# Patient Record
Sex: Female | Born: 1970
Health system: Southern US, Community
[De-identification: ages and names within clinical notes are randomized; demographics above are authoritative.]

## PROBLEM LIST (undated history)

## (undated) DIAGNOSIS — E119 Type 2 diabetes mellitus without complications: Secondary | ICD-10-CM

## (undated) DIAGNOSIS — M1710 Unilateral primary osteoarthritis, unspecified knee: Secondary | ICD-10-CM

## (undated) DIAGNOSIS — E559 Vitamin D deficiency, unspecified: Secondary | ICD-10-CM

## (undated) DIAGNOSIS — G4730 Sleep apnea, unspecified: Secondary | ICD-10-CM

## (undated) DIAGNOSIS — D219 Benign neoplasm of connective and other soft tissue, unspecified: Secondary | ICD-10-CM

## (undated) DIAGNOSIS — W19XXXA Unspecified fall, initial encounter: Secondary | ICD-10-CM

## (undated) DIAGNOSIS — E7800 Pure hypercholesterolemia, unspecified: Secondary | ICD-10-CM

## (undated) DIAGNOSIS — R7303 Prediabetes: Secondary | ICD-10-CM

## (undated) HISTORY — DX: Unilateral primary osteoarthritis, unspecified knee: M17.10

## (undated) HISTORY — DX: Morbid (severe) obesity due to excess calories: E66.01

## (undated) HISTORY — DX: Type 2 diabetes mellitus without complications: E11.9

## (undated) HISTORY — DX: Vitamin D deficiency, unspecified: E55.9

## (undated) HISTORY — DX: Benign neoplasm of connective and other soft tissue, unspecified: D21.9

## (undated) HISTORY — DX: Unspecified fall, initial encounter: W19.XXXA

## (undated) HISTORY — DX: Sleep apnea, unspecified: G47.30

## (undated) HISTORY — DX: Pure hypercholesterolemia, unspecified: E78.00

## (undated) HISTORY — DX: Prediabetes: R73.03

---
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
## 1994-11-19 HISTORY — PX: UMBILICAL HERNIA REPAIR: SHX196

## 2007-09-10 ENCOUNTER — Encounter: Payer: Self-pay | Admitting: Obstetrics and Gynecology

## 2007-09-10 ENCOUNTER — Ambulatory Visit: Payer: Self-pay | Admitting: Obstetrics & Gynecology

## 2007-09-11 ENCOUNTER — Ambulatory Visit: Payer: Self-pay | Admitting: *Deleted

## 2007-09-11 ENCOUNTER — Inpatient Hospital Stay (HOSPITAL_COMMUNITY): Admission: AD | Admit: 2007-09-11 | Discharge: 2007-09-14 | Payer: Self-pay | Admitting: Obstetrics & Gynecology

## 2011-02-20 ENCOUNTER — Other Ambulatory Visit: Payer: Self-pay | Admitting: Infectious Diseases

## 2011-02-20 ENCOUNTER — Ambulatory Visit
Admission: RE | Admit: 2011-02-20 | Discharge: 2011-02-20 | Disposition: A | Discharge status: Home / Self Care | Payer: No Typology Code available for payment source | Source: Ambulatory Visit | Attending: Infectious Diseases | Admitting: Infectious Diseases

## 2011-02-20 DIAGNOSIS — R7611 Nonspecific reaction to tuberculin skin test without active tuberculosis: Secondary | ICD-10-CM

## 2011-08-29 LAB — POCT URINALYSIS DIP (DEVICE)
Ketones, ur: NEGATIVE
Nitrite: NEGATIVE
Protein, ur: 30 — AB
Urobilinogen, UA: 1
pH: 6.5

## 2011-08-29 LAB — CBC
Hemoglobin: 11.1 — ABNORMAL LOW
MCHC: 33.6
MCV: 80.5
Platelets: 188
RBC: 4.09
RDW: 15.8 — ABNORMAL HIGH

## 2011-08-29 LAB — URINE MICROSCOPIC-ADD ON

## 2011-08-29 LAB — URINALYSIS, ROUTINE W REFLEX MICROSCOPIC: Protein, ur: NEGATIVE

## 2012-01-18 HISTORY — PX: CATARACT EXTRACTION: SUR2

## 2012-08-15 ENCOUNTER — Other Ambulatory Visit: Payer: Self-pay | Admitting: Gynecology

## 2012-08-15 DIAGNOSIS — Z1231 Encounter for screening mammogram for malignant neoplasm of breast: Secondary | ICD-10-CM

## 2012-09-09 ENCOUNTER — Ambulatory Visit
Admission: RE | Admit: 2012-09-09 | Discharge: 2012-09-09 | Disposition: A | Discharge status: Home / Self Care | Payer: 59 | Source: Ambulatory Visit | Attending: Gynecology | Admitting: Gynecology

## 2012-09-09 DIAGNOSIS — Z1231 Encounter for screening mammogram for malignant neoplasm of breast: Secondary | ICD-10-CM

## 2012-09-11 ENCOUNTER — Other Ambulatory Visit: Payer: Self-pay | Admitting: Gynecology

## 2012-09-11 DIAGNOSIS — R928 Other abnormal and inconclusive findings on diagnostic imaging of breast: Secondary | ICD-10-CM

## 2012-09-17 ENCOUNTER — Ambulatory Visit
Admission: RE | Admit: 2012-09-17 | Discharge: 2012-09-17 | Disposition: A | Discharge status: Home / Self Care | Payer: 59 | Source: Ambulatory Visit | Attending: Gynecology | Admitting: Gynecology

## 2012-09-17 DIAGNOSIS — R928 Other abnormal and inconclusive findings on diagnostic imaging of breast: Secondary | ICD-10-CM

## 2013-02-20 ENCOUNTER — Ambulatory Visit: Payer: Self-pay

## 2013-02-20 ENCOUNTER — Other Ambulatory Visit: Payer: Self-pay | Admitting: Occupational Medicine

## 2013-02-20 DIAGNOSIS — R52 Pain, unspecified: Secondary | ICD-10-CM

## 2013-08-12 ENCOUNTER — Other Ambulatory Visit: Payer: Self-pay

## 2013-08-12 ENCOUNTER — Encounter: Payer: Self-pay | Admitting: Gynecology

## 2013-08-12 DIAGNOSIS — Z1231 Encounter for screening mammogram for malignant neoplasm of breast: Secondary | ICD-10-CM

## 2013-08-17 ENCOUNTER — Ambulatory Visit (INDEPENDENT_AMBULATORY_CARE_PROVIDER_SITE_OTHER): Payer: 59 | Admitting: Gynecology

## 2013-08-17 ENCOUNTER — Encounter: Payer: Self-pay | Admitting: Gynecology

## 2013-08-17 VITALS — BP 118/78 | HR 80 | Resp 18 | Ht 65.5 in | Wt 242.0 lb

## 2013-08-17 DIAGNOSIS — Z0000 Encounter for general adult medical examination without abnormal findings: Secondary | ICD-10-CM

## 2013-08-17 DIAGNOSIS — Z01419 Encounter for gynecological examination (general) (routine) without abnormal findings: Secondary | ICD-10-CM

## 2013-08-17 LAB — LIPID PANEL
Cholesterol: 197 mg/dL (ref 0–200)
LDL Cholesterol: 110 mg/dL — ABNORMAL HIGH (ref 0–99)
Triglycerides: 75 mg/dL (ref <150)
VLDL: 15 mg/dL (ref 0–40)

## 2013-08-17 LAB — POCT URINALYSIS DIPSTICK

## 2013-08-17 NOTE — Patient Instructions (Signed)

## 2013-08-17 NOTE — Progress Notes (Signed)
42 y.o. Married African  female   [id number] here for annual exam. Pt is not currently sexually active.  Husband lives in [place] has been here this year but now is back.  Happy with condoms for now due to situation.  Cycles are regular, pt reports bilateral nipple tenderness, but she denies any nipple discharge, reports bra is well supporting, last coitus in August and had had 2 cycles since, discomfort improves after menses.  Pt denies any caffiene.  She breast fed all fo her children.  Has always had some nipple tenderness but this was a bit worse.  Patient's last menstrual period was 07/29/2013.          Sexually active: no  The current method of family planning is none.    Exercising: no  The patient does not participate in regular exercise at present. Last pap: 08/14/12 NEG HR HPV Alcohol: no Tobacco: no BSE: yes  Mammogram 10/13, BiRads 1 BSE: yes  Hgb: 13.7  ;  Urine: Blood 1, Trace Protein  Health Maintenance  Topic Date Due  . Pap Smear  04/04/1988  . Tetanus/tdap  04/04/1989  . Influenza Vaccine  06/19/2013    Family History  Problem Relation Age of Onset  . Hypertension Mother     There are no active problems to display for this patient.   Past Medical History  Diagnosis Date  . Fall 1984    Past Surgical History  Procedure Laterality Date  . Umbilical hernia repair  1996  . Cataract extraction  01/2012    Allergies: Review of patient's allergies indicates no known allergies.  No current outpatient prescriptions on file.   No current facility-administered medications for this visit.    ROS: Pertinent items are noted in HPI.  Exam:    BP 118/78  Pulse 80  Resp 18  LMP 07/29/2013 Weight change: @WEIGHTCHANGE @ Last 3 height recordings:  Ht Readings from Last 3 Encounters:  No data found for Ht   General appearance: alert, cooperative and appears stated age Head: Normocephalic, without obvious abnormality, atraumatic Neck: no adenopathy, no carotid  bruit, no JVD, supple, symmetrical, trachea midline and thyroid not enlarged, symmetric, no tenderness/mass/nodules Lungs: clear to auscultation bilaterally Breasts: normal appearance, no masses or tenderness,  No nipple discharge, retraction.  Pt re-examined in bra- poorly fitting with inadequate support, too much unfilled space Heart: regular rate and rhythm, S1, S2 normal, no murmur, click, rub or gallop Abdomen: soft, non-tender; bowel sounds normal; no masses,  no organomegaly Extremities: extremities normal, atraumatic, no cyanosis or edema Skin: Skin color, texture, turgor normal. No rashes or lesions Lymph nodes: Cervical, supraclavicular, and axillary nodes normal. no inguinal nodes palpated Neurologic: Grossly normal   Pelvic: External genitalia:  no lesions              Urethra: normal appearing urethra with no masses, tenderness or lesions              Bartholins and Skenes: Bartholin's, Urethra, Skene's normal                 Vagina: normal appearing vagina with normal color and discharge, no lesions              Cervix: normal appearance              Pap taken: no        Bimanual Exam:  Uterus:  uterus is normal size, shape, consistency and nontender  Adnexa:    normal adnexa in size, nontender and no masses                                      Rectovaginal: Confirms                                      Anus:  normal sphincter tone, no lesions  A: well woman Nipple tenderness-poorly fitting bra     P: mammogram annually pap smear not done Recommend get fitted for better bra and see if symptoms improve-agrees  LFT, VIT D return annually or prn   An After Visit Summary was printed and given to the patient.

## 2013-08-18 LAB — VITAMIN D 25 HYDROXY (VIT D DEFICIENCY, FRACTURES): Vit D, 25-Hydroxy: 19 ng/mL — ABNORMAL LOW (ref 30–89)

## 2013-08-19 ENCOUNTER — Telehealth: Payer: Self-pay | Admitting: *Deleted

## 2013-08-19 MED ORDER — VITAMIN D (ERGOCALCIFEROL) 1.25 MG (50000 UNIT) PO CAPS: ORAL_CAPSULE | ORAL | Status: DC

## 2013-08-19 NOTE — Telephone Encounter (Signed)
Patient notified of cholesterol being great and vitamin D being low at 19. Patient is aware to start Vitamin D 50,000 iu's q week for 6 to 8 weeks then she will take a otc vitamin D @ 800 iu's daily. (Per Vitamin D Protocol)  Her 3 month Vitamin D recheck is scheduled for 11/23/12 @ 9:45.  Vitamin D 50,000 iu's #26/3 rf's sent to [facility] as patient requested.

## 2013-08-19 NOTE — Telephone Encounter (Signed)
Message copied by Lorraine Lax on Wed Aug 19, 2013  9:07 AM ------      Message from: Douglass Rivers      Created: Tue Aug 18, 2013 12:14 PM       Vit d low, please call in 50K u vit d protocol, cholesterol great ------

## 2013-09-10 ENCOUNTER — Ambulatory Visit
Admission: RE | Admit: 2013-09-10 | Discharge: 2013-09-10 | Disposition: A | Discharge status: Home / Self Care | Payer: 59 | Source: Ambulatory Visit

## 2013-09-10 DIAGNOSIS — Z1231 Encounter for screening mammogram for malignant neoplasm of breast: Secondary | ICD-10-CM

## 2013-11-23 ENCOUNTER — Ambulatory Visit (INDEPENDENT_AMBULATORY_CARE_PROVIDER_SITE_OTHER): Payer: 59 | Admitting: Gynecology

## 2013-11-23 DIAGNOSIS — E559 Vitamin D deficiency, unspecified: Secondary | ICD-10-CM

## 2013-11-24 NOTE — Progress Notes (Signed)
Patient ID: Sonya Gallegos, female   DOB: Oct 29, 1971, 43 y.o.   MRN: [id number] For lab

## 2013-11-27 LAB — VITAMIN D 1,25 DIHYDROXY
VITAMIN D 1, 25 (OH) TOTAL: 65 pg/mL (ref 18–72)
VITAMIN D3 1, 25 (OH): 14 pg/mL
Vitamin D2 1, 25 (OH)2: 51 pg/mL

## 2014-07-13 ENCOUNTER — Other Ambulatory Visit: Payer: Self-pay | Admitting: Cardiology

## 2014-07-13 ENCOUNTER — Ambulatory Visit
Admission: RE | Admit: 2014-07-13 | Discharge: 2014-07-13 | Disposition: A | Discharge status: Home / Self Care | Payer: 59 | Source: Ambulatory Visit | Attending: Cardiology | Admitting: Cardiology

## 2014-07-13 DIAGNOSIS — M25561 Pain in right knee: Secondary | ICD-10-CM

## 2014-07-13 DIAGNOSIS — M549 Dorsalgia, unspecified: Secondary | ICD-10-CM

## 2014-07-13 IMAGING — CR DG LUMBAR SPINE COMPLETE 4+V
5 series · 5 of 5 positions shown · non-contrast
Comparison: None.

CLINICAL DATA: Low back pain.  No known injury

EXAM:
LUMBAR SPINE - COMPLETE 4+ VIEW

[t l-spine a.p.]
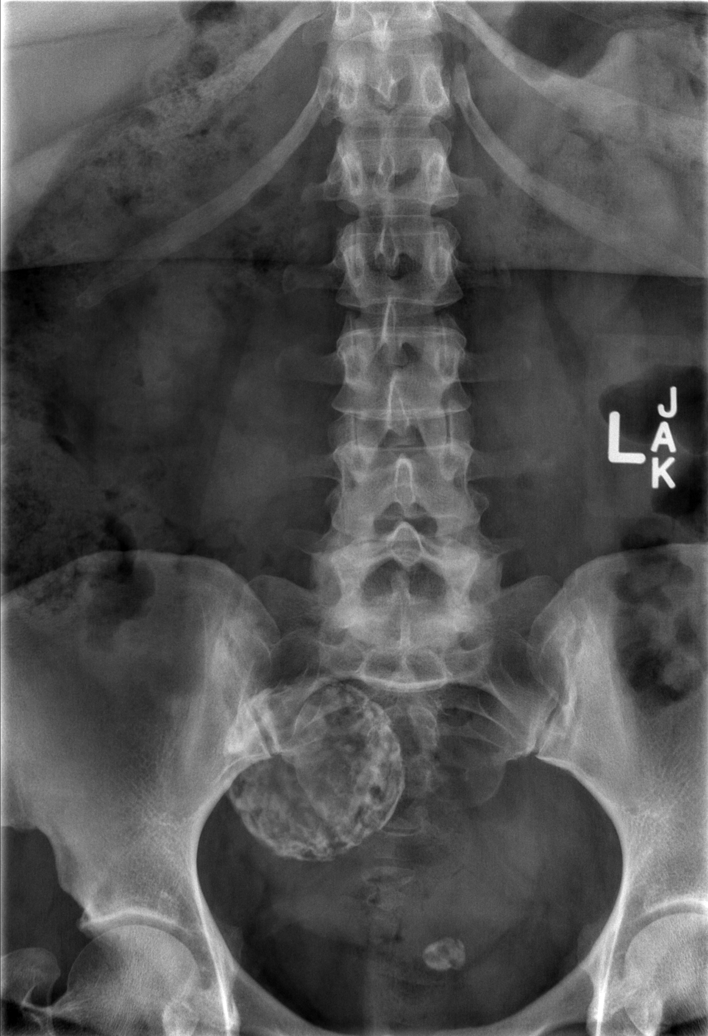

[t l-spine oblique exposure (1 of 2)]
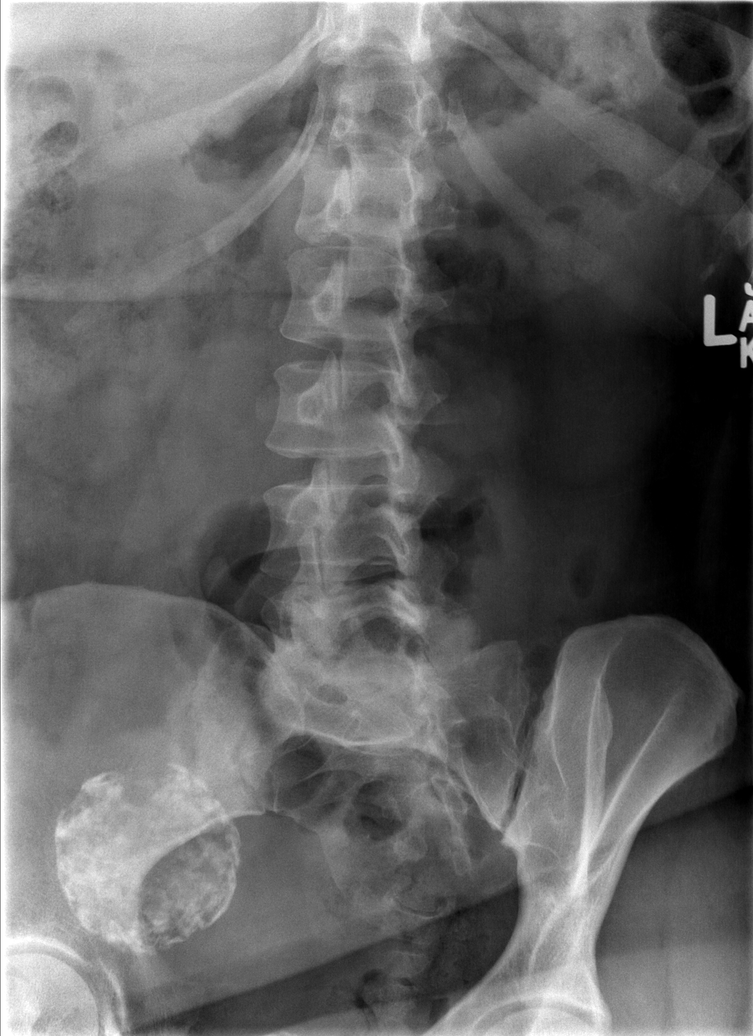

[t l-spine oblique exposure (2 of 2)]
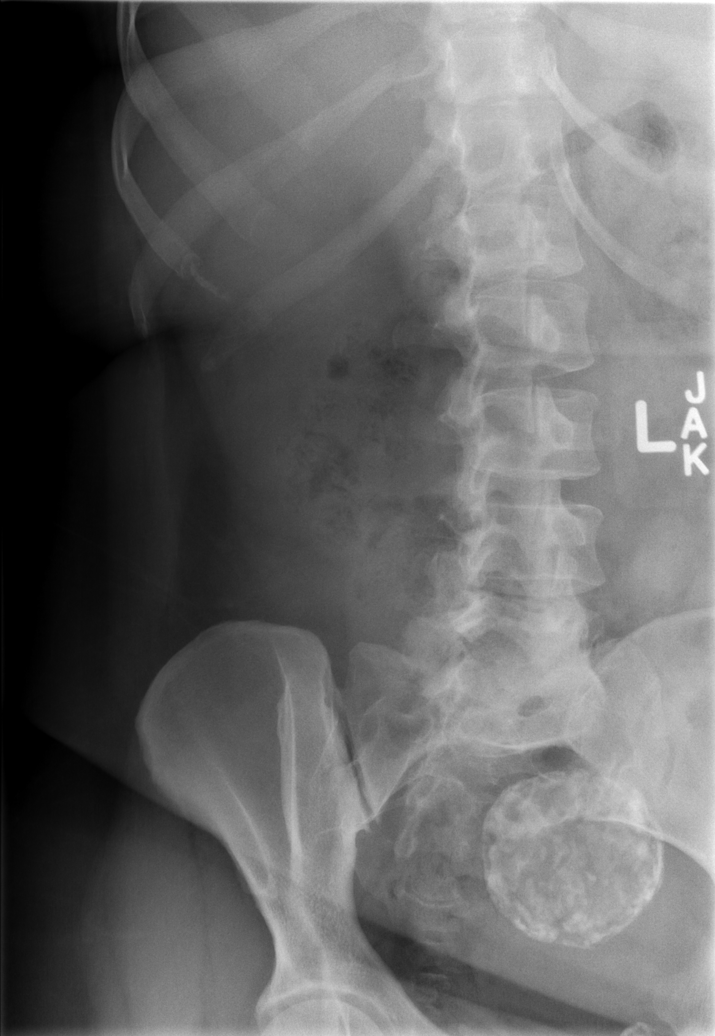

[t l-spine lat]
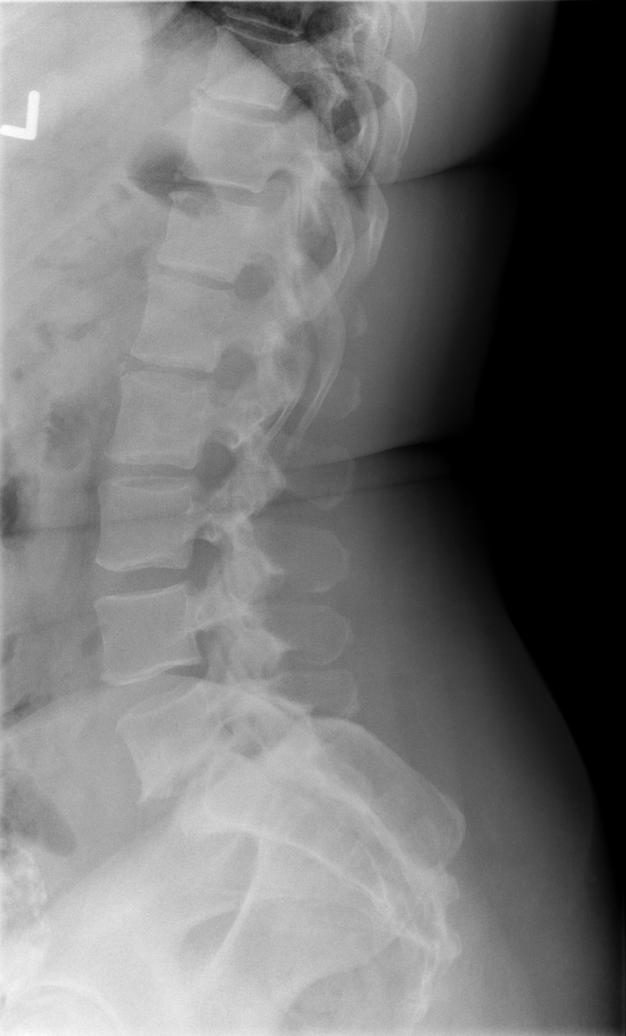

[t l-spine l5-s1 spot]
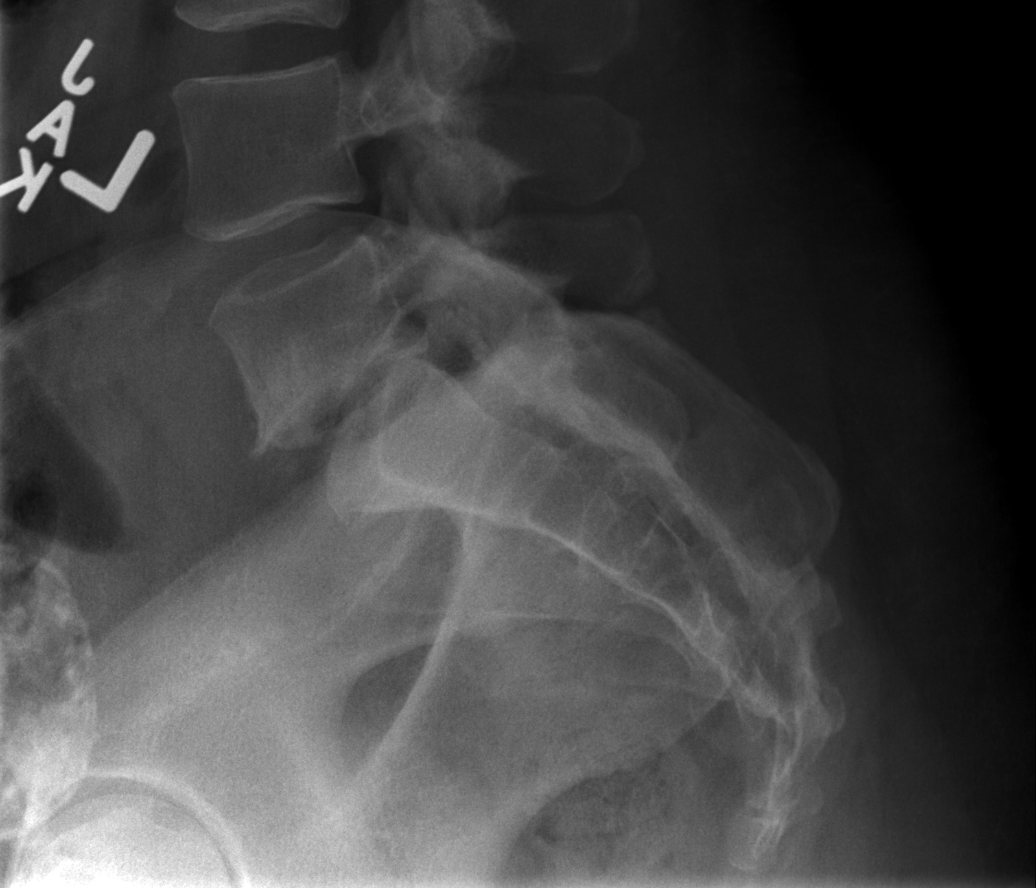

[5 of 5 positions shown; findings below may reference images not displayed]

FINDINGS: There is no evidence of lumbar spine fracture. Alignment is normal.
Disc space narrowing and ventral endplate spurring noted at the
L5-S1 level. Large calcified fibroid is noted measuring 6.5 cm.
Smaller calcified fibroid is also noted measuring 1.4 cm.
IMPRESSION: 1. L5-S1 degenerative disc disease.
2. Fibroids.

## 2014-08-12 ENCOUNTER — Other Ambulatory Visit: Payer: Self-pay

## 2014-08-12 DIAGNOSIS — Z1231 Encounter for screening mammogram for malignant neoplasm of breast: Secondary | ICD-10-CM

## 2014-08-20 ENCOUNTER — Ambulatory Visit: Payer: 59 | Admitting: Gynecology

## 2014-09-01 ENCOUNTER — Ambulatory Visit (INDEPENDENT_AMBULATORY_CARE_PROVIDER_SITE_OTHER): Payer: 59 | Admitting: Gynecology

## 2014-09-01 ENCOUNTER — Encounter: Payer: Self-pay | Admitting: Gynecology

## 2014-09-01 VITALS — BP 130/76 | HR 80 | Resp 16 | Ht 65.0 in | Wt 249.0 lb

## 2014-09-01 DIAGNOSIS — N920 Excessive and frequent menstruation with regular cycle: Secondary | ICD-10-CM

## 2014-09-01 DIAGNOSIS — R635 Abnormal weight gain: Secondary | ICD-10-CM

## 2014-09-01 DIAGNOSIS — Z01419 Encounter for gynecological examination (general) (routine) without abnormal findings: Secondary | ICD-10-CM

## 2014-09-01 DIAGNOSIS — E559 Vitamin D deficiency, unspecified: Secondary | ICD-10-CM

## 2014-09-01 DIAGNOSIS — D259 Leiomyoma of uterus, unspecified: Secondary | ICD-10-CM

## 2014-09-01 NOTE — Progress Notes (Signed)
43 y.o. Married [place] American female   [id number] here for annual exam. Pt is currently sexually active.  Pt is not using contraception but is not interested in conception, husband is back in [place] for last 43m.  No pain with sex.  Cycles remain monthly, unchanged.  Pt does report passing clots, increase in cramping  Patient's last menstrual period was 08/29/2014.          Sexually active: Yes.    The current method of family planning is none.    Exercising: No.  The patient does not participate in regular exercise at present. Last pap: 08/14/12 NEG HR HPV not indicated Alcohol: no Tobacco: no BSE:  Yes  Mammogram: 09/10/13 Bi-Rads 1  Labs: PCP  Health Maintenance  Topic Date Due  . Tetanus/tdap  04/04/1989  . Influenza Vaccine  06/19/2014  . Pap Smear  08/17/2016    Family History  Problem Relation Age of Onset  . Hypertension Mother     There are no active problems to display for this patient.   Past Medical History  Diagnosis Date  . Fall 1984  . Sleep apnea     Past Surgical History  Procedure Laterality Date  . Umbilical hernia repair  1996  . Cataract extraction  01/2012    Allergies: Review of patient's allergies indicates no known allergies.  Current Outpatient Prescriptions  Medication Sig Dispense Refill  . Vitamin D, Ergocalciferol, (DRISDOL) 50000 UNITS CAPS capsule Take one tablet by mouth once a week.  26 capsule  3   No current facility-administered medications for this visit.    ROS: Pertinent items are noted in HPI.  Exam:    LMP 08/29/2014 Weight change: @WEIGHTCHANGE @ Last 3 height recordings:  Ht Readings from Last 3 Encounters:  08/17/13 5' 5.5" (1.664 m)   General appearance: alert, cooperative and appears stated age Head: Normocephalic, without obvious abnormality, atraumatic Neck: no adenopathy, no carotid bruit, no JVD, supple, symmetrical, trachea midline and thyroid not enlarged, symmetric, no tenderness/mass/nodules Lungs: clear to  auscultation bilaterally Breasts: normal appearance, no masses or tenderness Heart: regular rate and rhythm, S1, S2 normal, no murmur, click, rub or gallop Abdomen: soft, non-tender; bowel sounds normal; no masses,  no organomegaly Extremities: extremities normal, atraumatic, no cyanosis or edema Skin: Skin color, texture, turgor normal. No rashes or lesions Lymph nodes: Cervical, supraclavicular, and axillary nodes normal. no inguinal nodes palpated Neurologic: Grossly normal   Pelvic: External genitalia:  no lesions              Urethra: normal appearing urethra with no masses, tenderness or lesions              Bartholins and Skenes: Bartholin's, Urethra, Skene's normal                 Vagina: normal appearing vagina with normal color and discharge, no lesions, menstrum              Cervix: normal appearance              Pap taken: No.        Bimanual Exam:  Uterus:  enlarged to 10 week's size, irregular                                      Adnexa:    no masses  Rectovaginal: Confirms                                      Anus:  normal sphincter tone, no lesions       1. Encounter for routine gynecological examination mammogram counseled on breast self exam, mammography screening, adequate intake of calcium and vitamin D, diet and exercise return annually or prn  2. Uterine leiomyoma, unspecified location Bleeding associated with fibroids reviewed - US Transvaginal Non-OB; Future  3. Menorrhagia with regular cycle Will try to obtain labs from PCP if done, pt reports - TSH - CBC - US Transvaginal Non-OB; Future  4. Vitamin D deficiency Will send refill pending results - Vit D  25 hydroxy (rtn osteoporosis monitoring  5. Weight gain Hs gained 20# in last 2y, reports increased fatigue after work. Discussed eliminating juices, sugars, sweets An After Visit Summary was printed and given to the patient.

## 2014-09-02 LAB — CBC
HCT: 37.7 % (ref 36.0–46.0)
Hemoglobin: 12.2 g/dL (ref 12.0–15.0)
MCH: 26.1 pg (ref 26.0–34.0)
MCHC: 32.4 g/dL (ref 30.0–36.0)
MCV: 80.6 fL (ref 78.0–100.0)
Platelets: 258 10*3/uL (ref 150–400)
RBC: 4.68 MIL/uL (ref 3.87–5.11)
RDW: 15.7 % — ABNORMAL HIGH (ref 11.5–15.5)
WBC: 5.7 10*3/uL (ref 4.0–10.5)

## 2014-09-02 LAB — VITAMIN D 25 HYDROXY (VIT D DEFICIENCY, FRACTURES): Vit D, 25-Hydroxy: 18 ng/mL — ABNORMAL LOW (ref 30–89)

## 2014-09-02 LAB — TSH: TSH: 1.909 u[IU]/mL (ref 0.350–4.500)

## 2014-09-03 ENCOUNTER — Telehealth: Payer: Self-pay | Admitting: Gynecology

## 2014-09-03 MED ORDER — VITAMIN D (ERGOCALCIFEROL) 1.25 MG (50000 UNIT) PO CAPS: ORAL_CAPSULE | ORAL | Status: DC

## 2014-09-03 NOTE — Telephone Encounter (Signed)
Left message for patient to call back. Need to go over benefits and schedule PUS °

## 2014-09-03 NOTE — Telephone Encounter (Signed)
Spoke with patient. Advised that per benefit quote received, she will be responsible to pay $139.79 when she comes in for PUS. Patient agreeable.  Patient will check her work schedule and will call back to schedule PUS

## 2014-09-03 NOTE — Telephone Encounter (Signed)
Pt is calling sabrina back

## 2014-09-03 NOTE — Addendum Note (Signed)
Addended by: Elveria Rising on: 09/03/2014 08:38 AM   Modules accepted: Orders

## 2014-09-13 ENCOUNTER — Ambulatory Visit
Admission: RE | Admit: 2014-09-13 | Discharge: 2014-09-13 | Disposition: A | Discharge status: Home / Self Care | Payer: 59 | Source: Ambulatory Visit

## 2014-09-13 DIAGNOSIS — Z1231 Encounter for screening mammogram for malignant neoplasm of breast: Secondary | ICD-10-CM

## 2014-09-20 ENCOUNTER — Encounter: Payer: Self-pay | Admitting: Gynecology

## 2014-12-22 ENCOUNTER — Telehealth: Payer: Self-pay | Admitting: Obstetrics and Gynecology

## 2014-12-22 NOTE — Telephone Encounter (Signed)
Return call to [place].

## 2014-12-22 NOTE — Telephone Encounter (Signed)
Spoke with patient. Asked if she has considered scheduling recommended PUS. Advised that per benefit quote received, she will be responsible to pay $119.69. Patient states that she will think about it and give [place] a call back next week.

## 2014-12-22 NOTE — Telephone Encounter (Signed)
Left message for patient to call back. Need to discuss scheduled PUS that was recommended by Dr Charlies Constable

## 2014-12-22 NOTE — Telephone Encounter (Signed)
Routing to Dr. Silva as FYI.

## 2014-12-23 NOTE — Telephone Encounter (Signed)
Please keep patient in in box regarding her enlarged uterus and heavy bleeding and cramping.  Ultrasound was recommended by Dr. Charlies Constable.  Patient is considering if she will proceed.  See previous notes.  [place]

## 2015-01-24 NOTE — Telephone Encounter (Signed)
Left message to call Kaitlyn at [phone]. 

## 2015-01-27 NOTE — Telephone Encounter (Signed)
Left message to call Kaitlyn at [phone]. 

## 2015-02-14 NOTE — Telephone Encounter (Signed)
Dr.Silva, would you like me to send letter to patient at this time regarding scheduling?

## 2015-02-14 NOTE — Telephone Encounter (Signed)
I agree with sending a letter.  Please write and I will sign it. Thank you!

## 2015-02-15 NOTE — Telephone Encounter (Signed)
I am out of the office today.  I will sign the letter tomorrow. Thank you.

## 2015-02-15 NOTE — Telephone Encounter (Signed)
I have written letter. Letter to your desk for signature.

## 2015-02-16 NOTE — Telephone Encounter (Signed)
Letter sent to patient's address on file.

## 2015-03-15 NOTE — Telephone Encounter (Signed)
No patient response to letter mailed on 02-16-15. Any further follow-up needed?

## 2015-03-15 NOTE — Telephone Encounter (Signed)
I think no further follow up needed.  Patient had a 10 week size uterus on exam and heavy but regular cycles.  We have communicated by person (Dr. Charlies Constable), by phone and by letter.  Patient responsibility to respond.

## 2015-03-16 NOTE — Telephone Encounter (Signed)
Encounter closed

## 2015-08-25 ENCOUNTER — Other Ambulatory Visit: Payer: Self-pay

## 2015-09-06 ENCOUNTER — Encounter: Payer: Self-pay | Admitting: Obstetrics and Gynecology

## 2015-09-06 ENCOUNTER — Ambulatory Visit (INDEPENDENT_AMBULATORY_CARE_PROVIDER_SITE_OTHER): Payer: 59 | Admitting: Obstetrics and Gynecology

## 2015-09-06 VITALS — BP 108/72 | HR 76 | Resp 14 | Ht 65.0 in | Wt 249.0 lb

## 2015-09-06 DIAGNOSIS — E559 Vitamin D deficiency, unspecified: Secondary | ICD-10-CM

## 2015-09-06 DIAGNOSIS — N841 Polyp of cervix uteri: Secondary | ICD-10-CM

## 2015-09-06 DIAGNOSIS — Z01419 Encounter for gynecological examination (general) (routine) without abnormal findings: Secondary | ICD-10-CM | POA: Diagnosis not present

## 2015-09-06 DIAGNOSIS — E663 Overweight: Secondary | ICD-10-CM | POA: Diagnosis not present

## 2015-09-06 DIAGNOSIS — Z124 Encounter for screening for malignant neoplasm of cervix: Secondary | ICD-10-CM

## 2015-09-06 DIAGNOSIS — Z0000 Encounter for general adult medical examination without abnormal findings: Secondary | ICD-10-CM | POA: Diagnosis not present

## 2015-09-06 LAB — CBC
HCT: 39 % (ref 36.0–46.0)
Hemoglobin: 12.9 g/dL (ref 12.0–15.0)
MCH: 26.3 pg (ref 26.0–34.0)
MCHC: 33.1 g/dL (ref 30.0–36.0)
MCV: 79.6 fL (ref 78.0–100.0)
MPV: 10.8 fL (ref 8.6–12.4)
PLATELETS: 234 10*3/uL (ref 150–400)
RBC: 4.9 MIL/uL (ref 3.87–5.11)
RDW: 16.1 % — AB (ref 11.5–15.5)
WBC: 6.5 10*3/uL (ref 4.0–10.5)

## 2015-09-06 LAB — COMPREHENSIVE METABOLIC PANEL
ALK PHOS: 36 U/L (ref 33–115)
ALT: 11 U/L (ref 6–29)
AST: 13 U/L (ref 10–35)
Albumin: 4.3 g/dL (ref 3.6–5.1)
BILIRUBIN TOTAL: 0.5 mg/dL (ref 0.2–1.2)
BUN: 9 mg/dL (ref 7–25)
CO2: 27 mmol/L (ref 20–31)
Calcium: 9.5 mg/dL (ref 8.6–10.2)
Chloride: 105 mmol/L (ref 98–110)
Creat: 0.61 mg/dL (ref 0.50–1.10)
GLUCOSE: 92 mg/dL (ref 65–99)
POTASSIUM: 3.9 mmol/L (ref 3.5–5.3)
Sodium: 142 mmol/L (ref 135–146)
Total Protein: 7 g/dL (ref 6.1–8.1)

## 2015-09-06 LAB — LIPID PANEL
CHOLESTEROL: 197 mg/dL (ref 125–200)
HDL: 65 mg/dL (ref >=46)
LDL Cholesterol: 119 mg/dL (ref <130)
Total CHOL/HDL Ratio: 3 Ratio (ref <=5.0)
Triglycerides: 64 mg/dL (ref <150)
VLDL: 13 mg/dL (ref <30)

## 2015-09-06 NOTE — Addendum Note (Signed)
Addended by: Dorothy Spark on: 09/06/2015 11:09 AM   Modules accepted: Orders

## 2015-09-06 NOTE — Progress Notes (Signed)
Patient ID: Sonya Gallegos, female   DOB: 02-01-1971, 44 y.o.   MRN: [id number] 44 y.o. [id number] MarriedAfrican AmericanF here for annual exam.  Sexually active, withdrawal for contraception. Not interested in contraception. No dyspareunia.  Period Duration (Days): 4 days  Period Pattern: Regular Menstrual Flow: Moderate Menstrual Control: Maxi pad Dysmenorrhea: None  She can saturate a pad in 4 hours, occasionally gushes blood, no heavier than in the past. Normal CBC and TSH last year. No BTB  Patient's last menstrual period was 09/05/2015.          Sexually active: Yes.    The current method of family planning is none.    Exercising: No.  The patient does not participate in regular exercise at present. Smoker:  no  Health Maintenance: Pap:  9-26-13WNL NEG HR HPV History of abnormal Pap:  no MMG:  09-14-14 WNL Colonoscopy:  Never BMD:   Never TDaP:  With in 10 years  Gardasil: N/A   reports that she has never smoked. She has never used smokeless tobacco. 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   Past Medical History  Diagnosis Date  . Fall 1984  . Sleep apnea   Not using a CPAP machine  Past Surgical History  Procedure Laterality Date  . Umbilical hernia repair  1996  . Cataract extraction  01/2012    No current outpatient prescriptions on file.   No current facility-administered medications for this visit.    Family History  Problem Relation Age of Onset  . Hypertension Mother     Review of Systems  Constitutional: Negative.   HENT: Negative.   Eyes: Negative.   Respiratory: Negative.   Cardiovascular: Negative.   Gastrointestinal: Negative.   Endocrine: Negative.   Genitourinary: Negative.   Musculoskeletal: Negative.   Skin: Negative.   Allergic/Immunologic: Negative.   Neurological: Negative.   Psychiatric/Behavioral: Negative.     Exam:   BP 108/72 mmHg  Pulse 76   Resp 14  Ht 5\' 5"  (1.651 m)  Wt 249 lb (112.946 kg)  BMI 41.44 kg/m2  LMP 09/05/2015  Weight change: @WEIGHTCHANGE @ Height:   Height: 5\' 5"  (165.1 cm)  Ht Readings from Last 3 Encounters:  09/06/15 5\' 5"  (1.651 m)  09/01/14 5\' 5"  (1.651 m)  08/17/13 5' 5.5" (1.664 m)    General appearance: alert, cooperative and appears stated age Head: Normocephalic, without obvious abnormality, atraumatic Neck: no adenopathy, supple, symmetrical, trachea midline and thyroid normal to inspection and palpation Lungs: clear to auscultation bilaterally Breasts: normal appearance, no masses or tenderness Heart: regular rate and rhythm Abdomen: soft, non-tender; bowel sounds normal; no masses,  no organomegaly Extremities: extremities normal, atraumatic, no cyanosis or edema Skin: Skin color, texture, turgor normal. No rashes or lesions Lymph nodes: Cervical, supraclavicular, and axillary nodes normal. No abnormal inguinal nodes palpated Neurologic: Grossly normal   Pelvic: External genitalia:  no lesions              Urethra:  normal appearing urethra with no masses, tenderness or lesions              Bartholins and Skenes: normal                 Vagina: normal appearing vagina with normal color and discharge, no lesions              Cervix: large endocervical polyp. The polyp was removed  with a ringed forcep.               Bimanual Exam:  Uterus:  normal size, contour, position, consistency, mobility, non-tender and anteverted              Adnexa: no mass, fullness, tenderness               Rectovaginal: Confirms               Anus:  normal sphincter tone, no lesions  Chaperone was present for exam.  A:  Well Woman with normal exam  Vit D deficiency  Overweight  H/O fibroid uterus, normal exam today  Cervical polyp    P:   Pap with hpv   CBC, CMP, fasting lipid profile, vit D  Recommend she take vit D, will make better recommendations after her labs return   Mammogram  Discussed  exercise and dietary changes  Recommended she take a multivitamin with folic acid, declines  Declines contraception, discussed risk of pregnancy  Cervical polyp removed

## 2015-09-06 NOTE — Patient Instructions (Signed)

## 2015-09-07 ENCOUNTER — Other Ambulatory Visit: Payer: Self-pay

## 2015-09-07 ENCOUNTER — Telehealth: Payer: Self-pay

## 2015-09-07 DIAGNOSIS — Z1231 Encounter for screening mammogram for malignant neoplasm of breast: Secondary | ICD-10-CM

## 2015-09-07 LAB — VITAMIN D 25 HYDROXY (VIT D DEFICIENCY, FRACTURES): Vit D, 25-Hydroxy: 9 ng/mL — ABNORMAL LOW (ref 30–100)

## 2015-09-07 MED ORDER — VITAMIN D (ERGOCALCIFEROL) 1.25 MG (50000 UNIT) PO CAPS: 50000.0000 [IU] | ORAL_CAPSULE | Freq: Every day | ORAL | Status: DC

## 2015-09-07 NOTE — Telephone Encounter (Signed)
Spoke with patient. Advised of results and message as seen below from [place]. Patient is agreeable and verbalizes understanding. Will start taking Vitamin D at this time. Rx for Vitamin D 50,000 IU daily #30 2RF sent to pharmacy on file. 3 month recheck scheduled for 12/05/2015 at 4pm. Agreeable to date and time.  Routing to provider for final review. Patient agreeable to disposition. Will close encounter.

## 2015-09-07 NOTE — Telephone Encounter (Signed)
-----   Message from Salvadore Dom, MD sent at 09/07/2015  2:40 PM EDT ----- Please inform the patient that her vit D is very low, please explain to her that it is very important for her to take vit D to prevent issues with bone loss (she doesn't like to take pills). Please call in 50,000 IU a day of vit D and set her up for a vit D level in 3 months. The rest of her blood work was normal. The cervical polyp was benign. Her pap is pending.  Thanks!!

## 2015-09-08 ENCOUNTER — Telehealth: Payer: Self-pay

## 2015-09-08 LAB — IPS PAP TEST WITH HPV

## 2015-09-08 NOTE — Telephone Encounter (Signed)
My error, please thank the pharmacist. It should be 50,000 IU q week, # 12, no refills. At the end of the 3 month she will have her blood work checked.

## 2015-09-08 NOTE — Telephone Encounter (Signed)
Spoke with Sonya Gallegos at [facility]. Sonya Gallegos is calling to verify instructions for Vitamin D 50,000 IU to take daily #30 2RF. Advised will speak with the provider to ensure correct directions and return call to verify. Sonya Gallegos is agreeable. Result note regarding Vitamin D level seen below.  Notes Recorded by Salvadore Dom, MD on 09/07/2015 at 2:40 PM Please inform the patient that her vit D is very low, please explain to her that it is very important for her to take vit D to prevent issues with bone loss (she doesn't like to take pills). Please call in 50,000 IU a day of vit D and set her up for a vit D level in 3 months. The rest of her blood work was normal. The cervical polyp was benign. Her pap is pending.  Thanks!!

## 2015-09-08 NOTE — Telephone Encounter (Signed)
Spoke with Sonya Gallegos at [facility]. Verfied instructions as seen below from [place]. Sonya Gallegos is agreeable and will make changes to the rx at this time.  Routing to provider for final review. Patient agreeable to disposition. Will close encounter.

## 2015-10-06 ENCOUNTER — Ambulatory Visit
Admission: RE | Admit: 2015-10-06 | Discharge: 2015-10-06 | Disposition: A | Discharge status: Home / Self Care | Payer: 59 | Source: Ambulatory Visit

## 2015-10-06 DIAGNOSIS — Z1231 Encounter for screening mammogram for malignant neoplasm of breast: Secondary | ICD-10-CM

## 2015-10-10 ENCOUNTER — Telehealth: Payer: Self-pay | Admitting: Obstetrics and Gynecology

## 2015-10-10 NOTE — Telephone Encounter (Signed)
Patient spoke with Dr. Talbert Nan at previous visit about filling out health screen form.  Patient dropped form off today and would faxed to the number on form.

## 2015-10-20 NOTE — Telephone Encounter (Signed)
Called patient and informed her we faxed the form and put a copy in the patient pick-up drawer as requested for her.

## 2015-10-20 NOTE — Telephone Encounter (Signed)
Patient calling to check on status of form. She'd like a call back to confirm the form has been faxed.

## 2015-12-05 ENCOUNTER — Other Ambulatory Visit: Payer: 59

## 2015-12-09 ENCOUNTER — Other Ambulatory Visit (INDEPENDENT_AMBULATORY_CARE_PROVIDER_SITE_OTHER): Payer: 59

## 2015-12-09 ENCOUNTER — Other Ambulatory Visit: Payer: 59

## 2015-12-09 DIAGNOSIS — E559 Vitamin D deficiency, unspecified: Secondary | ICD-10-CM

## 2015-12-10 LAB — VITAMIN D 25 HYDROXY (VIT D DEFICIENCY, FRACTURES): Vit D, 25-Hydroxy: 23 ng/mL — ABNORMAL LOW (ref 30–100)

## 2015-12-12 ENCOUNTER — Telehealth: Payer: Self-pay | Admitting: *Deleted

## 2015-12-12 NOTE — Telephone Encounter (Signed)
LMTC in regards to lab results -eh 

## 2015-12-12 NOTE — Telephone Encounter (Signed)
-----   Message from Salvadore Dom, MD sent at 12/12/2015  9:17 AM EST ----- Please inform the patient that her vit d has improved, but she is still low. Please have her switch to 2,000 IU daily (over the counter)

## 2015-12-27 NOTE — Telephone Encounter (Signed)
Patient is aware of lab results. She is going to increase her Vitamin D -eh

## 2016-05-23 ENCOUNTER — Telehealth: Payer: Self-pay

## 2016-05-23 ENCOUNTER — Institutional Professional Consult (permissible substitution): Payer: 59 | Admitting: Family Medicine

## 2016-05-23 NOTE — Telephone Encounter (Signed)
Message received from answering service that pt request to cancel appt today at 4pm. LM to CB. Victorino December

## 2016-05-25 ENCOUNTER — Encounter: Payer: Self-pay | Admitting: Family Medicine

## 2016-05-25 ENCOUNTER — Ambulatory Visit (INDEPENDENT_AMBULATORY_CARE_PROVIDER_SITE_OTHER): Payer: 59 | Admitting: Family Medicine

## 2016-05-25 DIAGNOSIS — Z7689 Persons encountering health services in other specified circumstances: Secondary | ICD-10-CM

## 2016-05-25 DIAGNOSIS — G4730 Sleep apnea, unspecified: Secondary | ICD-10-CM

## 2016-05-25 DIAGNOSIS — L989 Disorder of the skin and subcutaneous tissue, unspecified: Secondary | ICD-10-CM | POA: Diagnosis not present

## 2016-05-25 DIAGNOSIS — Z7189 Other specified counseling: Secondary | ICD-10-CM

## 2016-05-25 NOTE — Patient Instructions (Signed)
Calorie Counting for Weight Loss Calories are energy you get from the things you eat and drink. Your body uses this energy to keep you going throughout the day. The number of calories you eat affects your weight. When you eat more calories than your body needs, your body stores the extra calories as fat. When you eat fewer calories than your body needs, your body burns fat to get the energy it needs. Calorie counting means keeping track of how many calories you eat and drink each day. If you make sure to eat fewer calories than your body needs, you should lose weight. In order for calorie counting to work, you will need to eat the number of calories that are right for you in a day to lose a healthy amount of weight per week. A healthy amount of weight to lose per week is usually 1-2 lb (0.5-0.9 kg). A dietitian can determine how many calories you need in a day and give you suggestions on how to reach your calorie goal.  WHAT IS MY MY PLAN? My goal is to have __________ calories per day.  If I have this many calories per day, I should lose around __________ pounds per week. WHAT DO I NEED TO KNOW ABOUT CALORIE COUNTING? In order to meet your daily calorie goal, you will need to:  Find out how many calories are in each food you would like to eat. Try to do this before you eat.  Decide how much of the food you can eat.  Write down what you ate and how many calories it had. Doing this is called keeping a food log. WHERE DO I FIND CALORIE INFORMATION? The number of calories in a food can be found on a Nutrition Facts label. Note that all the information on a label is based on a specific serving of the food. If a food does not have a Nutrition Facts label, try to look up the calories online or ask your dietitian for help. HOW DO I DECIDE HOW MUCH TO EAT? To decide how much of the food you can eat, you will need to consider both the number of calories in one serving and the size of one serving. This  information can be found on the Nutrition Facts label. If a food does not have a Nutrition Facts label, look up the information online or ask your dietitian for help. Remember that calories are listed per serving. If you choose to have more than one serving of a food, you will have to multiply the calories per serving by the amount of servings you plan to eat. For example, the label on a package of bread might say that a serving size is 1 slice and that there are 90 calories in a serving. If you eat 1 slice, you will have eaten 90 calories. If you eat 2 slices, you will have eaten 180 calories. HOW DO I KEEP A FOOD LOG? After each meal, record the following information in your food log:  What you ate.  How much of it you ate.  How many calories it had.  Then, add up your calories. Keep your food log near you, such as in a small notebook in your pocket. Another option is to use a mobile app or website. Some programs will calculate calories for you and show you how many calories you have left each time you add an item to the log. WHAT ARE SOME CALORIE COUNTING TIPS?  Use your calories on foods   and drinks that will fill you up and not leave you hungry. Some examples of this include foods like nuts and nut butters, vegetables, lean proteins, and high-fiber foods (more than 5 g fiber per serving).  Eat nutritious foods and avoid empty calories. Empty calories are calories you get from foods or beverages that do not have many nutrients, such as candy and soda. It is better to have a nutritious high-calorie food (such as an avocado) than a food with few nutrients (such as a bag of chips).  Know how many calories are in the foods you eat most often. This way, you do not have to look up how many calories they have each time you eat them.  Look out for foods that may seem like low-calorie foods but are really high-calorie foods, such as baked goods, soda, and fat-free candy.  Pay attention to calories  in drinks. Drinks such as sodas, specialty coffee drinks, alcohol, and juices have a lot of calories yet do not fill you up. Choose low-calorie drinks like water and diet drinks.  Focus your calorie counting efforts on higher calorie items. Logging the calories in a garden salad that contains only vegetables is less important than calculating the calories in a milk shake.  Find a way of tracking calories that works for you. Get creative. Most people who are successful find ways to keep track of how much they eat in a day, even if they do not count every calorie. WHAT ARE SOME PORTION CONTROL TIPS?  Know how many calories are in a serving. This will help you know how many servings of a certain food you can have.  Use a measuring cup to measure serving sizes. This is helpful when you start out. With time, you will be able to estimate serving sizes for some foods.  Take some time to put servings of different foods on your favorite plates, bowls, and cups so you know what a serving looks like.  Try not to eat straight from a bag or box. Doing this can lead to overeating. Put the amount you would like to eat in a cup or on a plate to make sure you are eating the right portion.  Use smaller plates, glasses, and bowls to prevent overeating. This is a quick and easy way to practice portion control. If your plate is smaller, less food can fit on it.  Try not to multitask while eating, such as watching TV or using your computer. If it is time to eat, sit down at a table and enjoy your food. Doing this will help you to start recognizing when you are full. It will also make you more aware of what and how much you are eating. HOW CAN I CALORIE COUNT WHEN EATING OUT?  Ask for smaller portion sizes or child-sized portions.  Consider sharing an entree and sides instead of getting your own entree.  If you get your own entree, eat only half. Ask for a box at the beginning of your meal and put the rest of your  entree in it so you are not tempted to eat it.  Look for the calories on the menu. If calories are listed, choose the lower calorie options.  Choose dishes that include vegetables, fruits, whole grains, low-fat dairy products, and lean protein. Focusing on smart food choices from each of the 5 food groups can help you stay on track at restaurants.  Choose items that are boiled, broiled, grilled, or steamed.  Choose   water, milk, unsweetened iced tea, or other drinks without added sugars. If you want an alcoholic beverage, choose a lower calorie option. For example, a regular margarita can have up to 700 calories and a glass of wine has around 150.  Stay away from items that are buttered, battered, fried, or served with cream sauce. Items labeled "crispy" are usually fried, unless stated otherwise.  Ask for dressings, sauces, and syrups on the side. These are usually very high in calories, so do not eat much of them.  Watch out for salads. Many people think salads are a healthy option, but this is often not the case. Many salads come with bacon, fried chicken, lots of cheese, fried chips, and dressing. All of these items have a lot of calories. If you want a salad, choose a garden salad and ask for grilled meats or steak. Ask for the dressing on the side, or ask for olive oil and vinegar or lemon to use as dressing.  Estimate how many servings of a food you are given. For example, a serving of cooked rice is  cup or about the size of half a tennis ball or one cupcake wrapper. Knowing serving sizes will help you be aware of how much food you are eating at restaurants. The list below tells you how big or small some common portion sizes are based on everyday objects.  1 oz--4 stacked dice.  3 oz--1 deck of cards.  1 tsp--1 dice.  1 Tbsp-- a Ping-Pong ball.  2 Tbsp--1 Ping-Pong ball.   cup--1 tennis ball or 1 cupcake wrapper.  1 cup--1 baseball.   This information is not intended to  replace advice given to you by your health care provider. Make sure you discuss any questions you have with your health care provider.   Document Released: 11/05/2005 Document Revised: 11/26/2014 Document Reviewed: 09/10/2013 Elsevier Interactive Patient Education 2016 [employer] Inc[employer]  Exercising to Lose Weight Exercising can help you to lose weight. In order to lose weight through exercise, you need to do vigorous-intensity exercise. You can tell that you are exercising with vigorous intensity if you are breathing very hard and fast and cannot hold a conversation while exercising. Moderate-intensity exercise helps to maintain your current weight. You can tell that you are exercising at a moderate level if you have a higher heart rate and faster breathing, but you are still able to hold a conversation. HOW OFTEN SHOULD I EXERCISE? Choose an activity that you enjoy and set realistic goals. Your health care provider can help you to make an activity plan that works for you. Exercise regularly as directed by your health care provider. This may include:  Doing resistance training twice each week, such as:  Push-ups.  Sit-ups.  Lifting weights.  Using resistance bands.  Doing a given intensity of exercise for a given amount of time. Choose from these options:  150 minutes of moderate-intensity exercise every week.  75 minutes of vigorous-intensity exercise every week.  A mix of moderate-intensity and vigorous-intensity exercise every week. Children, pregnant women, people who are out of shape, people who are overweight, and older adults may need to consult a health care provider for individual recommendations. If you have any sort of medical condition, be sure to consult your health care provider before starting a new exercise program. WHAT ARE SOME ACTIVITIES THAT CAN HELP ME TO LOSE WEIGHT?   Walking at a rate of at least 4.5 miles an hour.  Jogging or running at a rate   of 5 miles per  hour.  Biking at a rate of at least 10 miles per hour.  Lap swimming.  Roller-skating or in-line skating.  Cross-country skiing.  Vigorous competitive sports, such as football, basketball, and soccer.  Jumping rope.  Aerobic dancing. HOW CAN I BE MORE ACTIVE IN MY DAY-TO-DAY ACTIVITIES?  Use the stairs instead of the elevator.  Take a walk during your lunch break.  If you drive, park your car farther away from work or school.  If you take public transportation, get off one stop early and walk the rest of the way.  Make all of your phone calls while standing up and walking around.  Get up, stretch, and walk around every 30 minutes throughout the day. WHAT GUIDELINES SHOULD I FOLLOW WHILE EXERCISING?  Do not exercise so much that you hurt yourself, feel dizzy, or get very short of breath.  Consult your health care provider prior to starting a new exercise program.  Wear comfortable clothes and shoes with good support.  Drink plenty of water while you exercise to prevent dehydration or heat stroke. Body water is lost during exercise and must be replaced.  Work out until you breathe faster and your heart beats faster.   This information is not intended to replace advice given to you by your health care provider. Make sure you discuss any questions you have with your health care provider.   Document Released: 12/08/2010 Document Revised: 11/26/2014 Document Reviewed: 04/08/2014 Elsevier Interactive Patient Education 2016 Elsevier Inc.  

## 2016-05-25 NOTE — Progress Notes (Signed)
Subjective:    Patient ID: Sonya Gallegos, female    DOB: 04-30-1971, 45 y.o.   MRN: [id number]  HPI Chief Complaint  Patient presents with  . Establish Care    no concerns. here to est care.    She is new to the practice and here to establish primary care.  Moved here from [place] several years ago. States her weight was normal when she lived there. States she is aware that her BMI places her in the mobidly obese category.  She is concerned about a fullness to the left side to her chin that has been there for several months. States it is not painful, nontender but she would like to have it checked. Denies any fever or chills, unexplained weight change, headaches, dizziness, chest pain, shortness of breath, abdominal pain, nausea, vomiting, diarrhea. Previous medical care: Dr. Montez Morita on [address] for past 3 years.  Last CPE: October 2016 Not using contraception however she does not desire pregnancy. Is having sex.  Other providers: Gynecologist: [place]  Past medical history: sleep apnea- states she does not use CPAP and turned the machine back in. Does not want further testing or a new machine. States if she loses weight she thinks this will improve. History of vitamin D deficiency. States she took prescription vitamin D and this has since resolved per patient.  Family history: HTN. Mother had stroke in her 44s. Now age 32  Social history: Lives with husband and children, works as Child psychotherapist, Denies Smoking, drinking alcohol, drug use  Diet: she eats what she wants Excerise: none  Last Menstrual cycle: Pregnancies: 5 with 3 kids  Reviewed allergies, medications, past medical, surgical, family, and social history.  Past Medical History  Diagnosis Date  . Fall 1984  . Sleep apnea   . Morbid obesity ([place])        Review of Systems Pertinent positives and negatives in the history of present illness.     Objective:   Physical Exam  Constitutional: She appears  well-developed and well-nourished. No distress.  HENT:  Head:    Fullness noted inferior to the left submandibular area. No discrete mass felt. No erythema, induration or fluctuance. Nontender. Appears subcutaneous. Dr. Redmond School also examined this.  Neck: Trachea normal, normal range of motion and full passive range of motion without pain. Neck supple. No thyroid mass and no thyromegaly present.  Cardiovascular: Normal rate, regular rhythm and normal heart sounds.   Pulmonary/Chest: Effort normal and breath sounds normal.  Lymphadenopathy:       Head (right side): No submandibular, no tonsillar, no preauricular, no posterior auricular and no occipital adenopathy present.       Head (left side): No submandibular, no tonsillar, no preauricular, no posterior auricular and no occipital adenopathy present.    She has no cervical adenopathy.       Right: No supraclavicular adenopathy present.       Left: No supraclavicular adenopathy present.   BP 126/80 mmHg  Pulse 74  Ht 5' 5.5" (1.664 m)  Wt 249 lb 3.2 oz (113.036 kg)  BMI 40.82 kg/m2  LMP 05/19/2016        Assessment & Plan:  Morbid obesity, unspecified obesity type ([place])  Encounter to establish care  Sleep apnea  Disorder of subcutaneous tissue  Discussed that as long as she continues to have her menstrual cycles and is having unprotected sex that there is a chance of pregnancy. She states she does not desire pregnancy however is  choosing to have unprotected sex. Discussed that the fullness to her chin appears to be subcutaneous and no discrete mass detected. Since this has been unchanged per patient for several months we will do watchful waiting and check this at the next appointment. Dr. Redmond School also examined this and agrees with plan of care. Counseled patient on obesity and healthy weight loss including portion sizes, food choices and exercise. Advised her that obesity places her at a greater risk of developing chronic  conditions such as diabetes, hypertension and heart disease. Discussed that sleep apnea also places her at a greater risk of developing chronic health conditions and I recommend that she be fitted for a new device however she refuses to do this. Recommend that she return when she is due for a physical exam and labs. Spent a minimum of 30 minutes face-to-face with patient and at least 50% was in counseling and coordination of care.

## 2016-09-06 ENCOUNTER — Ambulatory Visit (INDEPENDENT_AMBULATORY_CARE_PROVIDER_SITE_OTHER): Payer: 59 | Admitting: Obstetrics and Gynecology

## 2016-09-06 ENCOUNTER — Encounter: Payer: Self-pay | Admitting: Obstetrics and Gynecology

## 2016-09-06 VITALS — BP 118/78 | HR 84 | Resp 16 | Ht 65.5 in | Wt 250.0 lb

## 2016-09-06 DIAGNOSIS — N816 Rectocele: Secondary | ICD-10-CM

## 2016-09-06 DIAGNOSIS — Z01419 Encounter for gynecological examination (general) (routine) without abnormal findings: Secondary | ICD-10-CM | POA: Diagnosis not present

## 2016-09-06 DIAGNOSIS — N8111 Cystocele, midline: Secondary | ICD-10-CM

## 2016-09-06 DIAGNOSIS — Z0000 Encounter for general adult medical examination without abnormal findings: Secondary | ICD-10-CM | POA: Diagnosis not present

## 2016-09-06 DIAGNOSIS — E559 Vitamin D deficiency, unspecified: Secondary | ICD-10-CM

## 2016-09-06 LAB — COMPREHENSIVE METABOLIC PANEL
ALT: 11 U/L (ref 6–29)
AST: 13 U/L (ref 10–35)
Albumin: 4 g/dL (ref 3.6–5.1)
Alkaline Phosphatase: 37 U/L (ref 33–115)
BUN: 11 mg/dL (ref 7–25)
CHLORIDE: 105 mmol/L (ref 98–110)
CO2: 27 mmol/L (ref 20–31)
CREATININE: 0.59 mg/dL (ref 0.50–1.10)
Calcium: 9 mg/dL (ref 8.6–10.2)
Glucose, Bld: 103 mg/dL — ABNORMAL HIGH (ref 65–99)
POTASSIUM: 4.7 mmol/L (ref 3.5–5.3)
SODIUM: 140 mmol/L (ref 135–146)
Total Bilirubin: 0.3 mg/dL (ref 0.2–1.2)
Total Protein: 6.8 g/dL (ref 6.1–8.1)

## 2016-09-06 LAB — CBC
HCT: 38.6 % (ref 35.0–45.0)
Hemoglobin: 12.3 g/dL (ref 11.7–15.5)
MCH: 25.5 pg — AB (ref 27.0–33.0)
MCHC: 31.9 g/dL — AB (ref 32.0–36.0)
MCV: 79.9 fL — ABNORMAL LOW (ref 80.0–100.0)
MPV: 10.2 fL (ref 7.5–12.5)
PLATELETS: 236 10*3/uL (ref 140–400)
RBC: 4.83 MIL/uL (ref 3.80–5.10)
RDW: 15.3 % — ABNORMAL HIGH (ref 11.0–15.0)
WBC: 5.2 10*3/uL (ref 3.8–10.8)

## 2016-09-06 NOTE — Patient Instructions (Signed)

## 2016-09-06 NOTE — Progress Notes (Signed)
45 y.o. [id number] MarriedAfrican AmericanF here for annual exam.   Period Cycle (Days): 28 Period Duration (Days): 4-5 days  Period Pattern: Regular Menstrual Flow: Moderate Menstrual Control: Maxi pad Dysmenorrhea: None  Saturating a pad in 4 hours. No BTB. Sexually active, no pain.   Patient's last menstrual period was 09/01/2016.          Sexually active: Yes.    The current method of family planning is withdrawal (declines contraception) Exercising: Yes.    walking Smoker:  no  Health Maintenance: Pap:  09-06-15 WNL NEG HR HPV History of abnormal Pap:  no MMG:  10-07-15 WNL Colonoscopy:  Never BMD:   Never TDaP:  Unsure, UTD Gardasil: N/A   reports that she has never smoked. She has never used smokeless tobacco. 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 Kids are 16, 14 and 8. Betsy Coder is turning 8.   Past Medical History:  Diagnosis Date  . Fall 1984  . Morbid obesity ([place])   . Sleep apnea   Not using CPAP, reports decreased snoring, sleeping okay  Past Surgical History:  Procedure Laterality Date  . CATARACT EXTRACTION  01/2012  . UMBILICAL HERNIA REPAIR  1996    No current outpatient prescriptions on file.   No current facility-administered medications for this visit.     Family History  Problem Relation Age of Onset  . Hypertension Mother   . Stroke Mother     Review of Systems  Constitutional: Negative.   HENT: Negative.   Eyes: Negative.   Respiratory: Negative.   Cardiovascular: Negative.   Gastrointestinal: Negative.   Endocrine: Negative.   Genitourinary: Negative.   Musculoskeletal: Negative.   Skin: Negative.   Allergic/Immunologic: Negative.   Neurological: Negative.   Psychiatric/Behavioral: Negative.     Exam:   BP 118/78 (BP Location: Right Arm, Patient Position: Sitting, Cuff Size: Normal)   Pulse 84   Resp 16   Ht 5' 5.5" (1.664 m)   Wt 250 lb (113.4 kg)   LMP 09/01/2016    BMI 40.97 kg/m   Weight change: @WEIGHTCHANGE @ Height:   Height: 5' 5.5" (166.4 cm)  Ht Readings from Last 3 Encounters:  09/06/16 5' 5.5" (1.664 m)  05/25/16 5' 5.5" (1.664 m)  09/06/15 5\' 5"  (1.651 m)    General appearance: alert, cooperative and appears stated age Head: Normocephalic, without obvious abnormality, atraumatic Neck: no adenopathy, supple, symmetrical, trachea midline and thyroid normal to inspection and palpation Lungs: clear to auscultation bilaterally Breasts: normal appearance, no masses or tenderness Heart: regular rate and rhythm Abdomen: soft, non-tender; bowel sounds normal; no masses,  no organomegaly Extremities: extremities normal, atraumatic, no cyanosis or edema Skin: Skin color, texture, turgor normal. No rashes or lesions Lymph nodes: Cervical, supraclavicular, and axillary nodes normal. No abnormal inguinal nodes palpated Neurologic: Grossly normal   Pelvic: External genitalia:  no lesions              Urethra:  normal appearing urethra with no masses, tenderness or lesions              Bartholins and Skenes: normal                 Vagina: normal appearing vagina with a small grade 1-2 cystocele and 1-2 rectocele (not symptomatic)              Cervix: no lesions  Bimanual Exam:  Uterus:  normal size, contour, position, consistency, mobility, non-tender              Adnexa: no mass, fullness, tenderness               Rectovaginal: Confirms               Anus:  normal sphincter tone, no lesions  Chaperone was present for exam.  A:  Well Woman with normal exam  H/O vit d deficiency   Mild genital prolapse, not symptomatic  P:   No pap this year  Normal lipids last year  CBC, CMP, vit D  Discussed breast self exam  Discussed calcium and vit D intake  Discussed genital prolapse, information given. Try to avoid heavy lifting and straining (has to lift at work)

## 2016-09-07 LAB — VITAMIN D 25 HYDROXY (VIT D DEFICIENCY, FRACTURES): Vit D, 25-Hydroxy: 13 ng/mL — ABNORMAL LOW (ref 30–100)

## 2016-09-11 ENCOUNTER — Telehealth: Payer: Self-pay | Admitting: *Deleted

## 2016-09-11 MED ORDER — VITAMIN D (ERGOCALCIFEROL) 1.25 MG (50000 UNIT) PO CAPS: 50000.0000 [IU] | ORAL_CAPSULE | ORAL | 0 refills | Status: DC

## 2016-09-11 NOTE — Telephone Encounter (Signed)
-----   Message from Sonya Dom, MD sent at 09/10/2016  5:15 PM EDT ----- Please inform the patient that her vit d level is very low and start her on 50,000 IU of vit d3 weekly x 12 weeks. Then she should have a f/u vit D level. I'm sure she will need long term treatment with vit d.

## 2016-09-11 NOTE — Telephone Encounter (Signed)
Spoke with patient and gave lab results. Patient voiced understanding. Sent in RX to local pharmacy per patients request. Scheduled for 3 month recheck of vitamin D -eh

## 2016-09-14 ENCOUNTER — Encounter: Payer: Self-pay | Admitting: Family Medicine

## 2016-09-14 ENCOUNTER — Ambulatory Visit (INDEPENDENT_AMBULATORY_CARE_PROVIDER_SITE_OTHER): Payer: 59 | Admitting: Family Medicine

## 2016-09-14 ENCOUNTER — Other Ambulatory Visit: Payer: Self-pay | Admitting: Obstetrics and Gynecology

## 2016-09-14 VITALS — BP 120/64 | HR 71 | Ht 65.0 in | Wt 248.0 lb

## 2016-09-14 DIAGNOSIS — M545 Low back pain, unspecified: Secondary | ICD-10-CM

## 2016-09-14 DIAGNOSIS — Z0000 Encounter for general adult medical examination without abnormal findings: Secondary | ICD-10-CM | POA: Diagnosis not present

## 2016-09-14 DIAGNOSIS — L989 Disorder of the skin and subcutaneous tissue, unspecified: Secondary | ICD-10-CM | POA: Diagnosis not present

## 2016-09-14 DIAGNOSIS — R739 Hyperglycemia, unspecified: Secondary | ICD-10-CM

## 2016-09-14 DIAGNOSIS — E559 Vitamin D deficiency, unspecified: Secondary | ICD-10-CM

## 2016-09-14 DIAGNOSIS — Z1231 Encounter for screening mammogram for malignant neoplasm of breast: Secondary | ICD-10-CM

## 2016-09-14 LAB — POCT URINALYSIS DIPSTICK
BILIRUBIN UA: NEGATIVE
Glucose, UA: NEGATIVE
KETONES UA: NEGATIVE
Leukocytes, UA: NEGATIVE
Nitrite, UA: NEGATIVE
PH UA: 6
Protein, UA: NEGATIVE
Spec Grav, UA: >=1.03
Urobilinogen, UA: NEGATIVE

## 2016-09-14 LAB — BASIC METABOLIC PANEL
BUN: 10 mg/dL (ref 7–25)
CHLORIDE: 104 mmol/L (ref 98–110)
CO2: 25 mmol/L (ref 20–31)
CREATININE: 0.67 mg/dL (ref 0.50–1.10)
Calcium: 9.3 mg/dL (ref 8.6–10.2)
Glucose, Bld: 96 mg/dL (ref 65–99)
Potassium: 4.3 mmol/L (ref 3.5–5.3)
Sodium: 138 mmol/L (ref 135–146)

## 2016-09-14 LAB — LIPID PANEL
CHOLESTEROL: 196 mg/dL (ref 125–200)
HDL: 76 mg/dL (ref >=46)
LDL Cholesterol: 108 mg/dL (ref <130)
Total CHOL/HDL Ratio: 2.6 Ratio (ref <=5.0)
Triglycerides: 60 mg/dL (ref <150)
VLDL: 12 mg/dL (ref <30)

## 2016-09-14 LAB — HEMOGLOBIN A1C
HEMOGLOBIN A1C: 6.1 % — AB (ref <5.7)
MEAN PLASMA GLUCOSE: 128 mg/dL

## 2016-09-14 LAB — TSH: TSH: 2.05 m[IU]/L

## 2016-09-14 NOTE — Patient Instructions (Addendum)
Call and schedule your mammogram Check and see when your eye exam is Preventative Care for Adults - Female      MAINTAIN REGULAR HEALTH EXAMS:  A routine yearly physical is a good way to check in with your primary care provider about your health and preventive screening. It is also an opportunity to share updates about your health and any concerns you have, and receive a thorough all-over exam.   Most health insurance companies pay for at least some preventative services.  Check with your health plan for specific coverages.  WHAT PREVENTATIVE SERVICES DO WOMEN NEED?  Adult women should have their weight and blood pressure checked regularly.   Women age 36 and older should have their cholesterol levels checked regularly.  Women should be screened for cervical cancer with a Pap smear and pelvic exam beginning at either age 47, or 3 years after they become sexually activity.    Breast cancer screening generally begins at age 58 with a mammogram and breast exam by your primary care provider.    Beginning at age 40 and continuing to age 76, women should be screened for colorectal cancer.  Certain people may need continued testing until age 33.  Updating vaccinations is part of preventative care.  Vaccinations help protect against diseases such as the flu.  Osteoporosis is a disease in which the bones lose minerals and strength as we age. Women ages 59 and over should discuss this with their caregivers, as should women after menopause who have other risk factors.  Lab tests are generally done as part of preventative care to screen for anemia and blood disorders, to screen for problems with the kidneys and liver, to screen for bladder problems, to check blood sugar, and to check your cholesterol level.  Preventative services generally include counseling about diet, exercise, avoiding tobacco, drugs, excessive alcohol consumption, and sexually transmitted infections.    GENERAL RECOMMENDATIONS  FOR GOOD HEALTH:  Healthy diet:  Eat a variety of foods, including fruit, vegetables, animal or vegetable protein, such as meat, fish, chicken, and eggs, or beans, lentils, tofu, and grains, such as rice.  Drink plenty of water daily.  Decrease saturated fat in the diet, avoid lots of red meat, processed foods, sweets, fast foods, and fried foods.  Exercise:  Aerobic exercise helps maintain good heart health. At least 30-40 minutes of moderate-intensity exercise is recommended. For example, a brisk walk that increases your heart rate and breathing. This should be done on most days of the week.   Find a type of exercise or a variety of exercises that you enjoy so that it becomes a part of your daily life.  Examples are running, walking, swimming, water aerobics, and biking.  For motivation and support, explore group exercise such as aerobic class, spin class, Zumba, Yoga,or  martial arts, etc.    Set exercise goals for yourself, such as a certain weight goal, walk or run in a race such as a 5k walk/run.  Speak to your primary care provider about exercise goals.  Disease prevention:  If you smoke or chew tobacco, find out from your caregiver how to quit. It can literally save your life, no matter how long you have been a tobacco user. If you do not use tobacco, never begin.   Maintain a healthy diet and normal weight. Increased weight leads to problems with blood pressure and diabetes.   The Body Mass Index or BMI is a way of measuring how much of your body  is fat. Having a BMI above 27 increases the risk of heart disease, diabetes, hypertension, stroke and other problems related to obesity. Your caregiver can help determine your BMI and based on it develop an exercise and dietary program to help you achieve or maintain this important measurement at a healthful level.  High blood pressure causes heart and blood vessel problems.  Persistent high blood pressure should be treated with medicine  if weight loss and exercise do not work.   Fat and cholesterol leaves deposits in your arteries that can block them. This causes heart disease and vessel disease elsewhere in your body.  If your cholesterol is found to be high, or if you have heart disease or certain other medical conditions, then you may need to have your cholesterol monitored frequently and be treated with medication.   Ask if you should have a cardiac stress test if your history suggests this. A stress test is a test done on a treadmill that looks for heart disease. This test can find disease prior to there being a problem.  Menopause can be associated with physical symptoms and risks. Hormone replacement therapy is available to decrease these. You should talk to your caregiver about whether starting or continuing to take hormones is right for you.   Osteoporosis is a disease in which the bones lose minerals and strength as we age. This can result in serious bone fractures. Risk of osteoporosis can be identified using a bone density scan. Women ages 64 and over should discuss this with their caregivers, as should women after menopause who have other risk factors. Ask your caregiver whether you should be taking a calcium supplement and Vitamin D, to reduce the rate of osteoporosis.   Avoid drinking alcohol in excess (more than two drinks per day).  Avoid use of street drugs. Do not share needles with anyone. Ask for professional help if you need assistance or instructions on stopping the use of alcohol, cigarettes, and/or drugs.  Brush your teeth twice a day with fluoride toothpaste, and floss once a day. Good oral hygiene prevents tooth decay and gum disease. The problems can be painful, unattractive, and can cause other health problems. Visit your dentist for a routine oral and dental check up and preventive care every 6-12 months.   Look at your skin regularly.  Use a mirror to look at your back. Notify your caregivers of changes  in moles, especially if there are changes in shapes, colors, a size larger than a pencil eraser, an irregular border, or development of new moles.  Safety:  Use seatbelts 100% of the time, whether driving or as a passenger.  Use safety devices such as hearing protection if you work in environments with loud noise or significant background noise.  Use safety glasses when doing any work that could send debris in to the eyes.  Use a helmet if you ride a bike or motorcycle.  Use appropriate safety gear for contact sports.  Talk to your caregiver about gun safety.  Use sunscreen with a SPF (or skin protection factor) of 15 or greater.  Lighter skinned people are at a greater risk of skin cancer. Don't forget to also wear sunglasses in order to protect your eyes from too much damaging sunlight. Damaging sunlight can accelerate cataract formation.   Practice safe sex. Use condoms. Condoms are used for birth control and to help reduce the spread of sexually transmitted infections (or STIs).  Some of the STIs are gonorrhea (the clap),  chlamydia, syphilis, trichomonas, herpes, HPV (human papilloma virus) and HIV (human immunodeficiency virus) which causes AIDS. The herpes, HIV and HPV are viral illnesses that have no cure. These can result in disability, cancer and death.   Keep carbon monoxide and smoke detectors in your home functioning at all times. Change the batteries every 6 months or use a model that plugs into the wall.   Vaccinations:  Stay up to date with your tetanus shots and other required immunizations. You should have a booster for tetanus every 10 years. Be sure to get your flu shot every year, since 5%-20% of the [place] population comes down with the flu. The flu vaccine changes each year, so being vaccinated once is not enough. Get your shot in the fall, before the flu season peaks.   Other vaccines to consider:  Human Papilloma Virus or HPV causes cancer of the cervix, and other infections  that can be transmitted from person to person. There is a vaccine for HPV, and females should get immunized between the ages of 29 and 6. It requires a series of 3 shots.   Pneumococcal vaccine to protect against certain types of pneumonia.  This is normally recommended for adults age 27 or older.  However, adults younger than 46 years old with certain underlying conditions such as diabetes, heart or lung disease should also receive the vaccine.  Shingles vaccine to protect against Varicella Zoster if you are older than age 26, or younger than 46 years old with certain underlying illness.  Hepatitis A vaccine to protect against a form of infection of the liver by a virus acquired from food.  Hepatitis B vaccine to protect against a form of infection of the liver by a virus acquired from blood or body fluids, particularly if you work in health care.  If you plan to travel internationally, check with your local health department for specific vaccination recommendations.  Cancer Screening:  Breast cancer screening is essential to preventive care for women. All women age 61 and older should perform a breast self-exam every month. At age 13 and older, women should have their caregiver complete a breast exam each year. Women at ages 63 and older should have a mammogram (x-ray film) of the breasts. Your caregiver can discuss how often you need mammograms.    Cervical cancer screening includes taking a Pap smear (sample of cells examined under a microscope) from the cervix (end of the uterus). It also includes testing for HPV (Human Papilloma Virus, which can cause cervical cancer). Screening and a pelvic exam should begin at age 27, or 3 years after a woman becomes sexually active. Screening should occur every year, with a Pap smear but no HPV testing, up to age 90. After age 49, you should have a Pap smear every 3 years with HPV testing, if no HPV was found previously.   Most routine colon cancer  screening begins at the age of 65. On a yearly basis, doctors may provide special easy to use take-home tests to check for hidden blood in the stool. Sigmoidoscopy or colonoscopy can detect the earliest forms of colon cancer and is life saving. These tests use a small camera at the end of a tube to directly examine the colon. Speak to your caregiver about this at age 31, when routine screening begins (and is repeated every 5 years unless early forms of pre-cancerous polyps or small growths are found).

## 2016-09-14 NOTE — Progress Notes (Signed)
Subjective:    Patient ID: Sonya Gallegos, female    DOB: [birth year], 45 y.o.   MRN: [id number]  HPI Chief Complaint  Patient presents with  . fasting cpe    fasting cpe, low back pain from lifting. sees obgyn for paps, eye exams done yearly, flu shot will be done at work   She is new to the practice and here for a complete physical exam. Complains of intermittent low back pain with lifting and certain movements for the past 2-3 months. Pain is described as a dull ache, non radiating. 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 Pain is not interfering with her daily activities. She has not tried anti-inflammatories or any home treatment. Denies fever, chills, unexplained weight loss, fatigue, numbness, tingling, weakness. No saddle anesthesia. No loss of control of bowels or bladder.  She is concerned about a fullness to the skin on her left neck that has been present for many years. States when she was in [place] she was told it was just "fat". This was more than 10 years ago. She would like to have this checked again and possibly have a procedure to remove it. Denies pain, tenderness to the area.   Other providers: [facility]- Dr. Talbert Nan.  Eye doctor and dentist.   Past medical history: vitamin D deficiency. Is being treated by her OB/GYN.  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  Denies smoking, drinking alcohol, drug use  Diet: states she has a healthy diet Excerise: 2 times per week she walks 5-10 minutes.   Immunizations: flu shot at work. Up to date.   Health maintenance:  Mammogram: she is due, will make her appointment.  Colonoscopy: N/A  Last Gynecological Exam: up to date Last Menstrual cycle: 1 week ago Pregnancies: 5 Last Dental Exam: twice annually  Last Eye Exam: is due   Wears seatbelt always, uses sunscreen, smoke detectors in home and functioning, does not text while driving and feels  safe in home environment.   Reviewed allergies, medications, past medical, surgical, family, and social history.   Review of Systems Review of Systems Constitutional: -fever, -chills, -sweats, -unexpected weight change,-fatigue ENT: -runny nose, -ear pain, -sore throat Cardiology:  -chest pain, -palpitations, -edema Respiratory: -cough, -shortness of breath, -wheezing Gastroenterology: -abdominal pain, -nausea, -vomiting, -diarrhea, -constipation  Hematology: -bleeding or bruising problems Musculoskeletal: -arthralgias, -myalgias, -joint swelling, +back pain Ophthalmology: -vision changes Urology: -dysuria, -difficulty urinating, -hematuria, -urinary frequency, -urgency Neurology: -headache, -weakness, -tingling, -numbness       Objective:   Physical Exam BP 120/64   Pulse 71   Ht 5\' 5"  (1.651 m)   Wt 248 lb (112.5 kg)   LMP 09/01/2016   BMI 41.27 kg/m   General Appearance:    Alert, cooperative, no distress, appears stated age  Head:    Normocephalic, fullness of subcutaneous tissue to inferior left submandibular area. No discrete mass. No erythema, induration, fluctuance. Nontender.   Eyes:    PERRL, conjunctiva/corneas clear, EOM's intact, fundi    benign  Ears:    Normal TM's and external ear canals  Nose:   Nares normal, mucosa normal, no drainage or sinus   tenderness  Throat:   Lips, mucosa, and tongue normal; teeth and gums normal  Neck:   Supple, no lymphadenopathy;  thyroid:  no   enlargement/tenderness/nodules; no carotid   bruit or JVD  Back:    Spine nontender, no curvature,  ROM normal, no CVA     Tenderness.  Lungs:     Clear to auscultation bilaterally without wheezes, rales or     ronchi; respirations unlabored  Chest Wall:    No tenderness or deformity   Heart:    Regular rate and rhythm, S1 and S2 normal, no murmur, rub   or gallop  Breast Exam:    Done at OB/GYN  Abdomen:     Soft, non-tender, nondistended, normoactive bowel sounds,    no masses, no  hepatosplenomegaly  Genitalia:    Done at OB/GYN  Rectal:    Not done.   Extremities:   No clubbing, cyanosis or edema  Pulses:   2+ and symmetric all extremities  Skin:   Skin color, texture, turgor normal, no rashes or lesions  Lymph nodes:   Cervical, supraclavicular, and axillary nodes normal  Neurologic:   CNII-XII intact, normal strength, sensation and gait; reflexes 2+ and symmetric throughout          Psych:   Normal mood, affect, hygiene and grooming.    Urinalysis dipstick: trace of blood (menses)      Assessment & Plan:  Routine general medical examination at a health care facility - Plan: Urinalysis Dipstick, Basic metabolic panel, TSH, Lipid panel  Vitamin D deficiency  Morbid obesity ([place]) - Plan: Basic metabolic panel, TSH  Elevated serum glucose - Plan: Basic metabolic panel, TSH, Hemoglobin A1c  Disorder of the skin and subcutaneous tissue, unspecified - Plan: Ambulatory referral to General Surgery  Bilateral low back pain without sciatica, unspecified chronicity  Discussed that her BMI places her in the morbid obesity category in this increases her risk of developing chronic health conditions. Counseled on healthy lifestyle such as diet and exercise. Her gynecologist is treating her for vitamin D deficiency and is following up on this. Review of labs shows that she had an elevated serum glucose. Plan to order a BMP and hemoglobin A1c to further evaluate this. She is up-to-date on immunizations. Discussed safety and health promotion. She would like a referral to Gen. surgery for further evaluation and possible surgery for the subcutaneous tissue abnormality to the left side of her chin. Discussed that her intermittent low back pain is most likely due to muscle strain and I recommend conservative treatment. Discussed using heat to the area and taking anti-inflammatories for the next couple of weeks. Also discussed using good body mechanics and when lifting at work to  use her knees as opposed to her back to lift. Plan to follow-up pending labs.

## 2016-09-16 ENCOUNTER — Encounter: Payer: Self-pay | Admitting: Family Medicine

## 2016-09-16 DIAGNOSIS — R7303 Prediabetes: Secondary | ICD-10-CM | POA: Insufficient documentation

## 2016-10-01 ENCOUNTER — Other Ambulatory Visit: Payer: Self-pay | Admitting: General Surgery

## 2016-10-01 DIAGNOSIS — R609 Edema, unspecified: Secondary | ICD-10-CM

## 2016-10-03 ENCOUNTER — Ambulatory Visit
Admission: RE | Admit: 2016-10-03 | Discharge: 2016-10-03 | Disposition: A | Discharge status: Home / Self Care | Payer: 59 | Source: Ambulatory Visit | Attending: General Surgery | Admitting: General Surgery

## 2016-10-03 DIAGNOSIS — R609 Edema, unspecified: Secondary | ICD-10-CM

## 2016-10-08 ENCOUNTER — Ambulatory Visit
Admission: RE | Admit: 2016-10-08 | Discharge: 2016-10-08 | Disposition: A | Discharge status: Home / Self Care | Payer: 59 | Source: Ambulatory Visit | Attending: Obstetrics and Gynecology | Admitting: Obstetrics and Gynecology

## 2016-10-08 DIAGNOSIS — Z1231 Encounter for screening mammogram for malignant neoplasm of breast: Secondary | ICD-10-CM

## 2016-12-12 ENCOUNTER — Other Ambulatory Visit: Payer: Self-pay

## 2016-12-12 ENCOUNTER — Other Ambulatory Visit (INDEPENDENT_AMBULATORY_CARE_PROVIDER_SITE_OTHER): Payer: 59

## 2016-12-12 DIAGNOSIS — E559 Vitamin D deficiency, unspecified: Secondary | ICD-10-CM

## 2016-12-13 ENCOUNTER — Telehealth: Payer: Self-pay | Admitting: *Deleted

## 2016-12-13 LAB — VITAMIN D 25 HYDROXY (VIT D DEFICIENCY, FRACTURES): Vit D, 25-Hydroxy: 26 ng/mL — ABNORMAL LOW (ref 30–100)

## 2016-12-13 NOTE — Telephone Encounter (Signed)
-----   Message from Salvadore Dom, MD sent at 12/13/2016  8:49 AM EST ----- Please inform the patient that her vit d is still low, but much better than it was. Have her switch to taking 2,000 IU of vit D3 daily, long term (over the counter)

## 2016-12-13 NOTE — Telephone Encounter (Signed)
Left message to call regarding results -eh 

## 2016-12-17 NOTE — Progress Notes (Signed)
Spoke with patient and gave results and recommendations. Patient voiced understanding -eh 

## 2016-12-17 NOTE — Telephone Encounter (Signed)
Spoke with patient and gave results and recommendations. Patient voiced understanding -eh 

## 2017-07-24 ENCOUNTER — Ambulatory Visit (INDEPENDENT_AMBULATORY_CARE_PROVIDER_SITE_OTHER): Payer: 59 | Admitting: Family Medicine

## 2017-07-24 ENCOUNTER — Encounter: Payer: Self-pay | Admitting: Family Medicine

## 2017-07-24 VITALS — BP 120/80 | HR 98 | Temp 98.4°F | Resp 16 | Wt 256.0 lb

## 2017-07-24 DIAGNOSIS — Z9109 Other allergy status, other than to drugs and biological substances: Secondary | ICD-10-CM | POA: Diagnosis not present

## 2017-07-24 MED ORDER — FLUTICASONE PROPIONATE 50 MCG/ACT NA SUSP: 2.0000 | Freq: Every day | NASAL | 1 refills | Status: DC

## 2017-07-24 MED ORDER — CETIRIZINE HCL 10 MG PO TABS: 10.0000 mg | ORAL_TABLET | Freq: Every day | ORAL | 1 refills | Status: DC

## 2017-07-24 NOTE — Progress Notes (Addendum)
Subjective: Chief Complaint  Patient presents with  . allergies    sneezing, pain in back of head off and on, ear discomfort, sinus pressure     Sonya Gallegos is a 46 y.o. female who presents for a 4 month history of nasal congestion, rhinorrhea, sneezing, and left ear discomfort.   Denies fever, chills, sinus pain, sore throat, cough, chest pain, palpitations, shortness of breath, wheezing, abdominal pain, N/V/D. No LE edema.   Denies history of allergies but she recently moved to a new house prior to onset of symptoms.   Treatment to date: zyrtec intermittently.   Denies sick contacts.  No other aggravating or relieving factors.  No other c/o.  ROS as in subjective.   Objective: Vitals:   07/24/17 1625  BP: 120/80  Pulse: 98  Resp: 16  Temp: 98.4 F (36.9 C)  SpO2: 98%    General appearance: Alert, WD/WN, no distress, well appearing                             Skin: warm, no rash                           Head: no sinus tenderness                            Eyes: conjunctiva normal, corneas clear, PERRLA                            Ears: pearly TMs, external ear canals normal                          Nose: septum midline, turbinates swollen, with erythema and clear discharge             Mouth/throat: MMM, tongue normal, mild pharyngeal erythema                           Neck: supple, no adenopathy, no thyromegaly, nontender                          Heart: RRR, normal S1, S2, no murmurs                         Lungs: CTA bilaterally, no wheezes, rales, or rhonchi      Assessment: Environmental allergies - Plan: cetirizine (ZYRTEC) 10 MG tablet, fluticasone (FLONASE) 50 MCG/ACT nasal spray  Plan: Discussed diagnosis and treatment of allergies. Does not appear to have an infection.   Suggested symptomatic OTC remedies. Try Flonase and Zyrtec daily for the next 2 weeks.  Call/return in 2-3 days if symptoms aren't resolving.

## 2017-07-24 NOTE — Patient Instructions (Addendum)
Try daily Flonase and Zyrtec for the next 2 weeks and see if your symptoms improve.  You do not appear to have an infection or even a cold.

## 2017-07-24 NOTE — Addendum Note (Signed)
Addended by: Girtha Rm on: 07/24/2017 04:52 PM   Modules accepted: Orders

## 2017-09-17 ENCOUNTER — Other Ambulatory Visit: Payer: Self-pay | Admitting: Obstetrics and Gynecology

## 2017-09-17 DIAGNOSIS — Z1231 Encounter for screening mammogram for malignant neoplasm of breast: Secondary | ICD-10-CM

## 2017-09-19 ENCOUNTER — Ambulatory Visit: Payer: 59 | Admitting: Obstetrics and Gynecology

## 2017-10-15 ENCOUNTER — Ambulatory Visit
Admission: RE | Admit: 2017-10-15 | Discharge: 2017-10-15 | Disposition: A | Discharge status: Home / Self Care | Payer: 59 | Source: Ambulatory Visit | Attending: Obstetrics and Gynecology | Admitting: Obstetrics and Gynecology

## 2017-10-15 DIAGNOSIS — Z1231 Encounter for screening mammogram for malignant neoplasm of breast: Secondary | ICD-10-CM | POA: Diagnosis not present

## 2017-10-25 DIAGNOSIS — M2241 Chondromalacia patellae, right knee: Secondary | ICD-10-CM | POA: Diagnosis not present

## 2017-10-31 ENCOUNTER — Encounter: Payer: Self-pay | Admitting: Obstetrics and Gynecology

## 2017-10-31 ENCOUNTER — Other Ambulatory Visit: Payer: Self-pay

## 2017-10-31 ENCOUNTER — Ambulatory Visit (INDEPENDENT_AMBULATORY_CARE_PROVIDER_SITE_OTHER): Payer: 59 | Admitting: Obstetrics and Gynecology

## 2017-10-31 VITALS — BP 112/60 | HR 104 | Resp 18 | Ht 65.0 in | Wt 260.0 lb

## 2017-10-31 DIAGNOSIS — R7303 Prediabetes: Secondary | ICD-10-CM | POA: Diagnosis not present

## 2017-10-31 DIAGNOSIS — Z0000 Encounter for general adult medical examination without abnormal findings: Secondary | ICD-10-CM | POA: Diagnosis not present

## 2017-10-31 DIAGNOSIS — Z6841 Body Mass Index (BMI) 40.0 and over, adult: Secondary | ICD-10-CM

## 2017-10-31 DIAGNOSIS — E559 Vitamin D deficiency, unspecified: Secondary | ICD-10-CM

## 2017-10-31 DIAGNOSIS — Z01419 Encounter for gynecological examination (general) (routine) without abnormal findings: Secondary | ICD-10-CM

## 2017-10-31 NOTE — Progress Notes (Signed)
46 y.o. [id number] MarriedAfrican AmericanF here for annual exam.   She was noted to have a small cystocele and small rectocele at her annual exam last year. Not bothering her. Normal bowel and bladder function. Sexually active, no pain. No contraception since her last baby.  She has gained 10 lbs in the last year.  Period Cycle (Days): 28 Period Duration (Days): 4-5 days  Period Pattern: Regular Menstrual Flow: Moderate, Light, Heavy Menstrual Control: Maxi pad, Thin pad Menstrual Control Change Freq (Hours): changes pad every 4 hours on heavy days  Dysmenorrhea: None  Patient's last menstrual period was 10/19/2017.          Sexually active: Yes.    The current method of family planning is none.    Exercising: No.  The patient does not participate in regular exercise at present. Smoker:  no  Health Maintenance: Pap:  09-06-15 WNL NEG HR HPV  History of abnormal Pap:  no MMG:  10-15-17 WNL  Colonoscopy:  Never BMD:   Never TDaP:  Unsure, thinks UTD.  Gardasil: N/A    reports that  has never smoked. she has never used smokeless tobacco. She reports that she does not drink alcohol or use drugs. 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 Oldest daughter is a Equities trader, applying to college.   Past Medical History:  Diagnosis Date  . Fall 1984  . Morbid obesity ([place])   . Sleep apnea     Past Surgical History:  Procedure Laterality Date  . CATARACT EXTRACTION  01/2012  . UMBILICAL HERNIA REPAIR  1996    Current Outpatient Medications  Medication Sig Dispense Refill  . cetirizine (ZYRTEC) 10 MG tablet Take 1 tablet (10 mg total) by mouth daily. 30 tablet 1  . Cholecalciferol (VITAMIN D PO) Take by mouth.    . fluticasone (FLONASE) 50 MCG/ACT nasal spray Place 2 sprays into both nostrils daily. 16 g 1   No current facility-administered medications for this visit.     Family History  Problem Relation Age of Onset  . Hypertension Mother   . Stroke Mother   .  Diabetes Brother        died at age 35    Review of Systems  Constitutional: Negative.   HENT: Negative.   Eyes: Negative.   Respiratory: Negative.   Cardiovascular: Negative.   Gastrointestinal: Negative.   Endocrine: Negative.   Genitourinary: Negative.   Musculoskeletal: Negative.   Skin: Negative.   Allergic/Immunologic: Negative.   Neurological: Negative.   Psychiatric/Behavioral: Negative.     Exam:   BP 112/60 (BP Location: Right Arm, Patient Position: Sitting, Cuff Size: Normal)   Pulse (!) 104   Resp 18   Ht 5\' 5"  (1.651 m)   Wt 260 lb (117.9 kg)   LMP 10/19/2017   BMI 43.27 kg/m   Weight change: @WEIGHTCHANGE @ Height:   Height: 5\' 5"  (165.1 cm)  Ht Readings from Last 3 Encounters:  10/31/17 5\' 5"  (1.651 m)  09/14/16 5\' 5"  (1.651 m)  09/06/16 5' 5.5" (1.664 m)    General appearance: alert, cooperative and appears stated age Head: Normocephalic, without obvious abnormality, atraumatic Neck: no adenopathy, supple, symmetrical, trachea midline and thyroid normal to inspection and palpation Lungs: clear to auscultation bilaterally Cardiovascular: regular rate and rhythm Breasts: normal appearance, no masses or tenderness Abdomen: soft, non-tender; non distended,  no masses,  no organomegaly Extremities: extremities normal, atraumatic, no cyanosis or edema Skin: Skin color, texture, turgor normal.  No rashes or lesions Lymph nodes: Cervical, supraclavicular, and axillary nodes normal. No abnormal inguinal nodes palpated Neurologic: Grossly normal   Pelvic: External genitalia:  no lesions              Urethra:  normal appearing urethra with no masses, tenderness or lesions              Bartholins and Skenes: normal                 Vagina: normal appearing vagina with normal color and discharge, no lesions  Stable grade 1-2 cystocele and 1-2 rectocele              Cervix: no lesions               Bimanual Exam:  Uterus:  normal size, contour, position,  consistency, mobility, non-tender              Adnexa: no mass, fullness, tenderness               Rectovaginal: Confirms               Anus:  normal sphincter tone, no lesions  Chaperone was present for exam.  A:  Well Woman with normal exam  Obesity  Pre-diabetes  Vit d def  Mild genital prolapse, no change, not bothersome  P:   Pap next year  Mammogram UTD  Discussed breast self exam  Discussed calcium and vit D intake  Screening labs  TSH, HgbA1C, Vit d

## 2017-10-31 NOTE — Patient Instructions (Signed)

## 2017-11-01 LAB — HEMOGLOBIN A1C
Est. average glucose Bld gHb Est-mCnc: 140 mg/dL
Hgb A1c MFr Bld: 6.5 % — ABNORMAL HIGH (ref 4.8–5.6)

## 2017-11-01 LAB — COMPREHENSIVE METABOLIC PANEL
ALBUMIN: 4.2 g/dL (ref 3.5–5.5)
ALT: 18 IU/L (ref 0–32)
AST: 15 IU/L (ref 0–40)
Albumin/Globulin Ratio: 1.5 (ref 1.2–2.2)
Alkaline Phosphatase: 61 IU/L (ref 39–117)
BUN / CREAT RATIO: 19 (ref 9–23)
BUN: 14 mg/dL (ref 6–24)
Bilirubin Total: <0.2 mg/dL (ref 0.0–1.2)
CALCIUM: 9.1 mg/dL (ref 8.7–10.2)
CO2: 24 mmol/L (ref 20–29)
CREATININE: 0.73 mg/dL (ref 0.57–1.00)
Chloride: 105 mmol/L (ref 96–106)
GFR calc Af Amer: 113 mL/min/{1.73_m2} (ref >59)
GFR, EST NON AFRICAN AMERICAN: 98 mL/min/{1.73_m2} (ref >59)
GLOBULIN, TOTAL: 2.8 g/dL (ref 1.5–4.5)
Glucose: 134 mg/dL — ABNORMAL HIGH (ref 65–99)
Potassium: 3.9 mmol/L (ref 3.5–5.2)
SODIUM: 142 mmol/L (ref 134–144)
Total Protein: 7 g/dL (ref 6.0–8.5)

## 2017-11-01 LAB — CBC
HEMATOCRIT: 38.6 % (ref 34.0–46.6)
HEMOGLOBIN: 12.4 g/dL (ref 11.1–15.9)
MCH: 25.1 pg — ABNORMAL LOW (ref 26.6–33.0)
MCHC: 32.1 g/dL (ref 31.5–35.7)
MCV: 78 fL — AB (ref 79–97)
Platelets: 289 10*3/uL (ref 150–379)
RBC: 4.94 x10E6/uL (ref 3.77–5.28)
RDW: 16.7 % — ABNORMAL HIGH (ref 12.3–15.4)
WBC: 8.1 10*3/uL (ref 3.4–10.8)

## 2017-11-01 LAB — LIPID PANEL
CHOL/HDL RATIO: 2.7 ratio (ref 0.0–4.4)
Cholesterol, Total: 197 mg/dL (ref 100–199)
HDL: 74 mg/dL (ref >39)
LDL CALC: 79 mg/dL (ref 0–99)
Triglycerides: 218 mg/dL — ABNORMAL HIGH (ref 0–149)
VLDL CHOLESTEROL CAL: 44 mg/dL — AB (ref 5–40)

## 2017-11-01 LAB — TSH: TSH: 1.96 u[IU]/mL (ref 0.450–4.500)

## 2017-11-01 LAB — VITAMIN D 25 HYDROXY (VIT D DEFICIENCY, FRACTURES): Vit D, 25-Hydroxy: 13.2 ng/mL — ABNORMAL LOW (ref 30.0–100.0)

## 2017-11-05 ENCOUNTER — Telehealth: Payer: Self-pay

## 2017-11-05 DIAGNOSIS — R7989 Other specified abnormal findings of blood chemistry: Secondary | ICD-10-CM

## 2017-11-05 NOTE — Telephone Encounter (Signed)
-----   Message from Salvadore Dom, MD sent at 11/01/2017  3:19 PM EST ----- Please let the patient know that her HgbA1C is c/w with diabetes, her triglycerides are elevated (this could happen if she wasn't fasting) and her vit d level was very low. She needs to f/u with her primary, please send a copy of her labs to her primary and have her make an appointment. Please also call in 50,000 IU of vit d3. One tab po q week x 12 weeks, then she needs another vit d level. Make sure she understands she is going to need to continue vit d long term, but we will likely adjust her dose after her f/u blood work.

## 2017-11-05 NOTE — Telephone Encounter (Signed)
Left message to call Artin Mceuen at [phone]. 

## 2017-11-08 NOTE — Telephone Encounter (Signed)
Attempted to reach patient. Call could not be completed.

## 2017-11-18 MED ORDER — VITAMIN D (ERGOCALCIFEROL) 1.25 MG (50000 UNIT) PO CAPS: 50000.0000 [IU] | ORAL_CAPSULE | ORAL | 0 refills | Status: DC

## 2017-11-18 NOTE — Telephone Encounter (Signed)
Spoke with patient. Results given. Patient verbalizes understanding. Will call her PCP to make an appointment. Labs faxed to patient's PCP on file. Rx for Vitamin D 50,000 IU take one tablet weekly #12 0RF sent to pharmacy on file. Vitamin D recheck scheduled for 02/10/2018 at 3:30 pm. Patient is agreeable to date and time. Future order placed. Encounter closed.

## 2017-11-20 DIAGNOSIS — H524 Presbyopia: Secondary | ICD-10-CM | POA: Diagnosis not present

## 2017-11-20 DIAGNOSIS — H52223 Regular astigmatism, bilateral: Secondary | ICD-10-CM | POA: Diagnosis not present

## 2017-11-20 DIAGNOSIS — H5203 Hypermetropia, bilateral: Secondary | ICD-10-CM | POA: Diagnosis not present

## 2017-11-20 DIAGNOSIS — H5232 Aniseikonia: Secondary | ICD-10-CM | POA: Diagnosis not present

## 2018-02-10 ENCOUNTER — Telehealth: Payer: Self-pay | Admitting: Obstetrics and Gynecology

## 2018-02-10 ENCOUNTER — Other Ambulatory Visit: Payer: 59

## 2018-02-10 NOTE — Telephone Encounter (Signed)
Patient dnka her vitamin d lab appointment today. I spoke with the patient and she had "forgot about the appointment". Patient did reschedule to 02/12/18 @ 1:40 pm.

## 2018-02-12 ENCOUNTER — Other Ambulatory Visit (INDEPENDENT_AMBULATORY_CARE_PROVIDER_SITE_OTHER): Payer: 59

## 2018-02-12 DIAGNOSIS — R7989 Other specified abnormal findings of blood chemistry: Secondary | ICD-10-CM

## 2018-02-13 LAB — VITAMIN D 25 HYDROXY (VIT D DEFICIENCY, FRACTURES): Vit D, 25-Hydroxy: 20.8 ng/mL — ABNORMAL LOW (ref 30.0–100.0)

## 2018-02-14 ENCOUNTER — Telehealth: Payer: Self-pay

## 2018-02-14 MED ORDER — VITAMIN D (ERGOCALCIFEROL) 1.25 MG (50000 UNIT) PO CAPS: 50000.0000 [IU] | ORAL_CAPSULE | ORAL | 0 refills | Status: DC

## 2018-02-14 NOTE — Telephone Encounter (Signed)
-----   Message from Salvadore Dom, MD sent at 02/13/2018  5:37 PM EDT ----- Please let the patient know that her vit d has improved, but is still low. Please call in another script for vit d 3, 50,000 IU 1 tab po q week x 12 more weeks and then recheck her vit d level

## 2018-02-14 NOTE — Telephone Encounter (Signed)
Spoke with patient and notified vitamin d improved but still low. Will send in refill for Vitamin D #12, 1 po q week for 12 weeks and made recheck Vit D lab appt.for 04-28-18 3:30pm. Patient voiced understanding.

## 2018-03-10 DIAGNOSIS — M2241 Chondromalacia patellae, right knee: Secondary | ICD-10-CM | POA: Diagnosis not present

## 2018-04-23 ENCOUNTER — Telehealth: Payer: Self-pay | Admitting: Obstetrics and Gynecology

## 2018-04-23 NOTE — Telephone Encounter (Signed)
Patient canceled her lab appointment 04/28/18. Patient states she will call later to reschedule.

## 2018-04-28 ENCOUNTER — Ambulatory Visit: Payer: 59

## 2018-09-10 ENCOUNTER — Other Ambulatory Visit: Payer: Self-pay | Admitting: Obstetrics and Gynecology

## 2018-09-10 DIAGNOSIS — Z1231 Encounter for screening mammogram for malignant neoplasm of breast: Secondary | ICD-10-CM

## 2018-10-21 ENCOUNTER — Ambulatory Visit
Admission: RE | Admit: 2018-10-21 | Discharge: 2018-10-21 | Disposition: A | Discharge status: Home / Self Care | Payer: 59 | Source: Ambulatory Visit | Attending: Obstetrics and Gynecology | Admitting: Obstetrics and Gynecology

## 2018-10-21 DIAGNOSIS — Z1231 Encounter for screening mammogram for malignant neoplasm of breast: Secondary | ICD-10-CM | POA: Diagnosis not present

## 2018-11-03 ENCOUNTER — Telehealth: Payer: Self-pay | Admitting: Family Medicine

## 2018-11-03 NOTE — Telephone Encounter (Signed)
appt made

## 2018-11-03 NOTE — Telephone Encounter (Signed)
Yes. Thanks. Preferable in the morning

## 2018-11-03 NOTE — Telephone Encounter (Signed)
Pt needs a CPE by the end of the year for insurance purposes, is it ok for me to work her in somewhere on your schedule?

## 2018-11-13 ENCOUNTER — Ambulatory Visit (INDEPENDENT_AMBULATORY_CARE_PROVIDER_SITE_OTHER): Payer: 59 | Admitting: Family Medicine

## 2018-11-13 ENCOUNTER — Encounter: Payer: Self-pay | Admitting: Family Medicine

## 2018-11-13 VITALS — BP 110/70 | HR 82 | Ht 65.25 in | Wt 255.6 lb

## 2018-11-13 DIAGNOSIS — R7303 Prediabetes: Secondary | ICD-10-CM

## 2018-11-13 DIAGNOSIS — Z13220 Encounter for screening for lipoid disorders: Secondary | ICD-10-CM | POA: Diagnosis not present

## 2018-11-13 DIAGNOSIS — R7309 Other abnormal glucose: Secondary | ICD-10-CM

## 2018-11-13 DIAGNOSIS — Z23 Encounter for immunization: Secondary | ICD-10-CM

## 2018-11-13 DIAGNOSIS — Z0000 Encounter for general adult medical examination without abnormal findings: Secondary | ICD-10-CM

## 2018-11-13 DIAGNOSIS — Z8669 Personal history of other diseases of the nervous system and sense organs: Secondary | ICD-10-CM

## 2018-11-13 DIAGNOSIS — E559 Vitamin D deficiency, unspecified: Secondary | ICD-10-CM | POA: Diagnosis not present

## 2018-11-13 DIAGNOSIS — R221 Localized swelling, mass and lump, neck: Secondary | ICD-10-CM | POA: Insufficient documentation

## 2018-11-13 LAB — POCT URINALYSIS DIP (PROADVANTAGE DEVICE)
BILIRUBIN UA: NEGATIVE mg/dL
Bilirubin, UA: NEGATIVE
Glucose, UA: NEGATIVE mg/dL
LEUKOCYTES UA: NEGATIVE
Nitrite, UA: NEGATIVE
PH UA: 5.5 (ref 5.0–8.0)
PROTEIN UA: NEGATIVE mg/dL
SPECIFIC GRAVITY, URINE: 1.03
UUROB: NEGATIVE

## 2018-11-13 NOTE — Patient Instructions (Signed)
You received your flu shot and Tdap today.  You will receive a phone call from the ENT regarding the fullness of your neck.  I will encourage you to cut back on portion sizes, calories and carbohydrates (potatoes, rice, bread, pasta) and increase your physical activity.  We will call you with your lab results.    Preventative Care for Adults - Female      MAINTAIN REGULAR HEALTH EXAMS:  A routine yearly physical is a good way to check in with your primary care provider about your health and preventive screening. It is also an opportunity to share updates about your health and any concerns you have, and receive a thorough all-over exam.   Most health insurance companies pay for at least some preventative services.  Check with your health plan for specific coverages.  WHAT PREVENTATIVE SERVICES DO WOMEN NEED?  Adult women should have their weight and blood pressure checked regularly.   Women age 43 and older should have their cholesterol levels checked regularly.  Women should be screened for cervical cancer with a Pap smear and pelvic exam beginning at age 8.   Breast cancer screening generally begins at age 77 with a mammogram and breast exam by your primary care provider.    Beginning at age 6 and continuing to age 87, women should be screened for colorectal cancer.  Certain people may need continued testing until age 14.  Updating vaccinations is part of preventative care.  Vaccinations help protect against diseases such as the flu.  Osteoporosis is a disease in which the bones lose minerals and strength as we age. Women ages 43 and over should discuss this with their caregivers, as should women after menopause who have other risk factors.  Lab tests are generally done as part of preventative care to screen for anemia and blood disorders, to screen for problems with the kidneys and liver, to screen for bladder problems, to check blood sugar, and to check your cholesterol  level.  Preventative services generally include counseling about diet, exercise, avoiding tobacco, drugs, excessive alcohol consumption, and sexually transmitted infections.    GENERAL RECOMMENDATIONS FOR GOOD HEALTH:  Healthy diet:  Eat a variety of foods, including fruit, vegetables, animal or vegetable protein, such as meat, fish, chicken, and eggs, or beans, lentils, tofu, and grains, such as rice.  Drink plenty of water daily.  Decrease saturated fat in the diet, avoid lots of red meat, processed foods, sweets, fast foods, and fried foods.  Exercise:  Aerobic exercise helps maintain good heart health. At least 30-40 minutes of moderate-intensity exercise is recommended. For example, a brisk walk that increases your heart rate and breathing. This should be done on most days of the week.   Find a type of exercise or a variety of exercises that you enjoy so that it becomes a part of your daily life.  Examples are running, walking, swimming, water aerobics, and biking.  For motivation and support, explore group exercise such as aerobic class, spin class, Zumba, Yoga,or  martial arts, etc.    Set exercise goals for yourself, such as a certain weight goal, walk or run in a race such as a 5k walk/run.  Speak to your primary care provider about exercise goals.  Disease prevention:  If you smoke or chew tobacco, find out from your caregiver how to quit. It can literally save your life, no matter how long you have been a tobacco user. If you do not use tobacco, never begin.  Maintain a healthy diet and normal weight. Increased weight leads to problems with blood pressure and diabetes.   The Body Mass Index or BMI is a way of measuring how much of your body is fat. Having a BMI above 27 increases the risk of heart disease, diabetes, hypertension, stroke and other problems related to obesity. Your caregiver can help determine your BMI and based on it develop an exercise and dietary program to  help you achieve or maintain this important measurement at a healthful level.  High blood pressure causes heart and blood vessel problems.  Persistent high blood pressure should be treated with medicine if weight loss and exercise do not work.   Fat and cholesterol leaves deposits in your arteries that can block them. This causes heart disease and vessel disease elsewhere in your body.  If your cholesterol is found to be high, or if you have heart disease or certain other medical conditions, then you may need to have your cholesterol monitored frequently and be treated with medication.   Ask if you should have a cardiac stress test if your history suggests this. A stress test is a test done on a treadmill that looks for heart disease. This test can find disease prior to there being a problem.  Menopause can be associated with physical symptoms and risks. Hormone replacement therapy is available to decrease these. You should talk to your caregiver about whether starting or continuing to take hormones is right for you.   Osteoporosis is a disease in which the bones lose minerals and strength as we age. This can result in serious bone fractures. Risk of osteoporosis can be identified using a bone density scan. Women ages 23 and over should discuss this with their caregivers, as should women after menopause who have other risk factors. Ask your caregiver whether you should be taking a calcium supplement and Vitamin D, to reduce the rate of osteoporosis.   Avoid drinking alcohol in excess (more than two drinks per day).  Avoid use of street drugs. Do not share needles with anyone. Ask for professional help if you need assistance or instructions on stopping the use of alcohol, cigarettes, and/or drugs.  Brush your teeth twice a day with fluoride toothpaste, and floss once a day. Good oral hygiene prevents tooth decay and gum disease. The problems can be painful, unattractive, and can cause other health  problems. Visit your dentist for a routine oral and dental check up and preventive care every 6-12 months.   Look at your skin regularly.  Use a mirror to look at your back. Notify your caregivers of changes in moles, especially if there are changes in shapes, colors, a size larger than a pencil eraser, an irregular border, or development of new moles.  Safety:  Use seatbelts 100% of the time, whether driving or as a passenger.  Use safety devices such as hearing protection if you work in environments with loud noise or significant background noise.  Use safety glasses when doing any work that could send debris in to the eyes.  Use a helmet if you ride a bike or motorcycle.  Use appropriate safety gear for contact sports.  Talk to your caregiver about gun safety.  Use sunscreen with a SPF (or skin protection factor) of 15 or greater.  Lighter skinned people are at a greater risk of skin cancer. Don't forget to also wear sunglasses in order to protect your eyes from too much damaging sunlight. Damaging sunlight can accelerate cataract  formation.   Practice safe sex. Use condoms. Condoms are used for birth control and to help reduce the spread of sexually transmitted infections (or STIs).  Some of the STIs are gonorrhea (the clap), chlamydia, syphilis, trichomonas, herpes, HPV (human papilloma virus) and HIV (human immunodeficiency virus) which causes AIDS. The herpes, HIV and HPV are viral illnesses that have no cure. These can result in disability, cancer and death.   Keep carbon monoxide and smoke detectors in your home functioning at all times. Change the batteries every 6 months or use a model that plugs into the wall.   Vaccinations:  Stay up to date with your tetanus shots and other required immunizations. You should have a booster for tetanus every 10 years. Be sure to get your flu shot every year, since 5%-20% of the [place] population comes down with the flu. The flu vaccine changes each year,  so being vaccinated once is not enough. Get your shot in the fall, before the flu season peaks.   Other vaccines to consider:  Human Papilloma Virus or HPV causes cancer of the cervix, and other infections that can be transmitted from person to person. There is a vaccine for HPV, and females should get immunized between the ages of 49 and 54. It requires a series of 3 shots.   Pneumococcal vaccine to protect against certain types of pneumonia.  This is normally recommended for adults age 36 or older.  However, adults younger than 48 years old with certain underlying conditions such as diabetes, heart or lung disease should also receive the vaccine.  Shingles vaccine to protect against Varicella Zoster if you are older than age 79, or younger than 48 years old with certain underlying illness.  Hepatitis A vaccine to protect against a form of infection of the liver by a virus acquired from food.  Hepatitis B vaccine to protect against a form of infection of the liver by a virus acquired from blood or body fluids, particularly if you work in health care.  If you plan to travel internationally, check with your local health department for specific vaccination recommendations.  Cancer Screening:  Breast cancer screening is essential to preventive care for women. All women age 43 and older should perform a breast self-exam every month. At age 56 and older, women should have their caregiver complete a breast exam each year. Women at ages 53 and older should have a mammogram (x-ray film) of the breasts. Your caregiver can discuss how often you need mammograms.    Cervical cancer screening includes taking a Pap smear (sample of cells examined under a microscope) from the cervix (end of the uterus). It also includes testing for HPV (Human Papilloma Virus, which can cause cervical cancer). Screening and a pelvic exam should begin at age 62, or 3 years after a woman becomes sexually active. Screening should  occur every year, with a Pap smear but no HPV testing, up to age 33. After age 2, you should have a Pap smear every 3 years with HPV testing, if no HPV was found previously.   Most routine colon cancer screening begins at the age of 17. On a yearly basis, doctors may provide special easy to use take-home tests to check for hidden blood in the stool. Sigmoidoscopy or colonoscopy can detect the earliest forms of colon cancer and is life saving. These tests use a small camera at the end of a tube to directly examine the colon. Speak to your caregiver about this at  age 33, when routine screening begins (and is repeated every 5 years unless early forms of pre-cancerous polyps or small growths are found).

## 2018-11-13 NOTE — Progress Notes (Signed)
Subjective:    Patient ID: Sonya Gallegos, female    DOB: 1971/07/04, 47 y.o.   MRN: [id number]  HPI Chief Complaint  Patient presents with  . fasting cpe    fasting cpe , swelling in necking, sees obgyn, gets eyes checked yearly   She is here for a complete physical exam. She also has chronic health conditions that require follow up and she is aware. Concerns related to neck fullness that need to be addressed.   Other providers: OB/GYN-Dr. Talbert Nan   Complains of left anterior neck fullness. This was evaluated and US performed in 2017.  Repots tenderness to palpation.  No difficulty swallowing.   Vitamin D deficiency- takes supplement some day.  Elevated Hgb A1c 6.5% last December.   History of sleep apnea. States she stopped the machine several years ago and she refuses new sleep study. States she would not use the CPAP then and would not in the future.   5 brothers and 7 sisters.  1 brother passed and had diabetes.  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 Became tearful.  Excerise: nothing   Immunizations: Tdap unknown. Denies flu shot   Health maintenance:  Mammogram: 10/21/2018 and normal  Colonoscopy: N/A Last Gynecological Exam: 10/2017 Last Dental Exam: every 6 months  Last Eye Exam: annually and UTD   Wears seatbelt always, smoke detectors in home and functioning, does not text while driving and feels safe in home environment.   Reviewed allergies, medications, past medical, surgical, family, and social history.    Review of Systems Review of Systems Constitutional: -fever, -chills, -sweats, -unexpected weight change,-fatigue ENT: -runny nose, -ear pain, -sore throat Cardiology:  -chest pain, -palpitations, -edema Respiratory: -cough, -shortness of breath, -wheezing Gastroenterology: -abdominal  pain, -nausea, -vomiting, -diarrhea, -constipation  Hematology: -bleeding or bruising problems Musculoskeletal: -arthralgias, -myalgias, -joint swelling, -back pain Ophthalmology: -vision changes Urology: -dysuria, -difficulty urinating, -hematuria, -urinary frequency, -urgency Neurology: -headache, -weakness, -tingling, -numbness       Objective:   Physical Exam BP 110/70   Pulse 82   Ht 5' 5.25" (1.657 m)   Wt 255 lb 9.6 oz (115.9 kg)   LMP 10/19/2018   BMI 42.21 kg/m   General Appearance:    Alert, cooperative, no distress, appears stated age  Head:    Normocephalic, without obvious abnormality, atraumatic  Eyes:    PERRL, conjunctiva/corneas clear, EOM's intact, fundi    benign  Ears:    Normal TM's and external ear canals  Nose:   Nares normal, mucosa normal, no drainage or sinus   tenderness  Throat:   Lips, mucosa, and tongue normal; teeth and gums normal  Neck:   Supple, no lymphadenopathy;  thyroid:  no   enlargement/tenderness/nodules; no carotid   bruit or JVD.  Left anterior neck fullness without palpable mass.  Back:    Spine nontender, no curvature, ROM normal, no CVA     tenderness  Lungs:     Clear to auscultation bilaterally without wheezes, rales or     ronchi; respirations unlabored  Chest Wall:    No tenderness or deformity   Heart:    Regular rate and rhythm, S1 and S2 normal, no murmur, rub   or gallop  Breast Exam:    OB/GYN  Abdomen:     Soft, non-tender, nondistended, normoactive bowel sounds,  no masses, no hepatosplenomegaly  Genitalia:    OB/GYN     Extremities:   No clubbing, cyanosis or edema  Pulses:   2+ and symmetric all extremities  Skin:   Skin color, texture, turgor normal, no rashes or lesions  Lymph nodes:   Cervical, supraclavicular, and axillary nodes normal  Neurologic:   CNII-XII intact, normal strength, sensation and gait; reflexes 2+ and symmetric throughout          Psych:   Normal mood, affect, hygiene and  grooming.    Urinalysis dipstick: negative       Assessment & Plan:  Routine general medical examination at a health care facility - Plan: CBC with Differential/Platelet, Comprehensive metabolic panel, TSH, T4, free, T3, Lipid panel, POCT Urinalysis DIP (Proadvantage Device)  Morbid obesity (HCC) - Plan: Comprehensive metabolic panel, TSH, T4, free, T3, Lipid panel, Hemoglobin A1c  Prediabetes - Plan: Comprehensive metabolic panel  Vitamin D deficiency - Plan: VITAMIN D 25 Hydroxy (Vit-D Deficiency, Fractures)  History of sleep apnea  Needs flu shot - Plan: Flu Vaccine QUAD 36+ mos IM  Need for diphtheria-tetanus-pertussis (Tdap) vaccine - Plan: Tdap vaccine greater than or equal to 7yo IM  Fullness of neck - Plan: Ambulatory referral to ENT  Screening for lipid disorders - Plan: Lipid panel  Elevated hemoglobin A1c - Plan: Hemoglobin A1c  She is a 47 year old female who is here today for her fasting CPE.  She is somewhat emotional due to her mother being quite ill. History of sleep apnea and states she refuses to wear the CPAP.  States she does not want another sleep study because she will not use the machine.  Discussed potential long-term health consequences of untreated sleep apnea including heart failure, hypertension fatigue etc. History of elevated hemoglobin A1c.  It appears that her last hemoglobin A1c was done by her OB/GYN and was 6.5%.  She was instructed to follow-up with her PCP but she did not.  Recheck hemoglobin A1c today. Counseling done on morbid obesity and potential worsening health related to this.  Advised her to cut back on calories, portion sizes and carbohydrates specifically.  Encouraged her to increase her physical activity. History of vitamin D deficiency.  Takes vitamin D some days.  Recheck vitamin D level. History of left anterior neck fullness that she thinks is slightly larger and concerned about appearance.  She is not having difficulty swallowing.   Reviewed ultrasound from 2017 with her.  She would like to see an ENT for further evaluation of this.  Referral was made. Flu shot given.  Tdap was also given.  Counseling on all components of vaccines was done. Sees her OB/GYN for mammograms and Pap smears. Follow-up pending lab results.

## 2018-11-14 LAB — LIPID PANEL
Chol/HDL Ratio: 2.9 ratio (ref 0.0–4.4)
Cholesterol, Total: 187 mg/dL (ref 100–199)
HDL: 65 mg/dL (ref >39)
LDL Calculated: 110 mg/dL — ABNORMAL HIGH (ref 0–99)
Triglycerides: 62 mg/dL (ref 0–149)
VLDL Cholesterol Cal: 12 mg/dL (ref 5–40)

## 2018-11-14 LAB — COMPREHENSIVE METABOLIC PANEL
ALT: 10 IU/L (ref 0–32)
AST: 12 IU/L (ref 0–40)
Albumin/Globulin Ratio: 1.6 (ref 1.2–2.2)
Albumin: 4.2 g/dL (ref 3.5–5.5)
Alkaline Phosphatase: 39 IU/L (ref 39–117)
BUN/Creatinine Ratio: 16 (ref 9–23)
BUN: 11 mg/dL (ref 6–24)
Bilirubin Total: 0.4 mg/dL (ref 0.0–1.2)
CALCIUM: 9.8 mg/dL (ref 8.7–10.2)
CO2: 22 mmol/L (ref 20–29)
Chloride: 104 mmol/L (ref 96–106)
Creatinine, Ser: 0.69 mg/dL (ref 0.57–1.00)
GFR, EST AFRICAN AMERICAN: 119 mL/min/{1.73_m2} (ref >59)
GFR, EST NON AFRICAN AMERICAN: 103 mL/min/{1.73_m2} (ref >59)
GLUCOSE: 98 mg/dL (ref 65–99)
Globulin, Total: 2.7 g/dL (ref 1.5–4.5)
Potassium: 4.2 mmol/L (ref 3.5–5.2)
Sodium: 139 mmol/L (ref 134–144)
TOTAL PROTEIN: 6.9 g/dL (ref 6.0–8.5)

## 2018-11-14 LAB — CBC WITH DIFFERENTIAL/PLATELET
Basophils Absolute: 0 10*3/uL (ref 0.0–0.2)
Basos: 1 %
EOS (ABSOLUTE): 0.1 10*3/uL (ref 0.0–0.4)
Eos: 1 %
Hematocrit: 37.7 % (ref 34.0–46.6)
Hemoglobin: 12 g/dL (ref 11.1–15.9)
Immature Grans (Abs): 0 10*3/uL (ref 0.0–0.1)
Immature Granulocytes: 0 %
Lymphocytes Absolute: 2.7 10*3/uL (ref 0.7–3.1)
Lymphs: 50 %
MCH: 25 pg — ABNORMAL LOW (ref 26.6–33.0)
MCHC: 31.8 g/dL (ref 31.5–35.7)
MCV: 79 fL (ref 79–97)
Monocytes Absolute: 0.4 10*3/uL (ref 0.1–0.9)
Monocytes: 7 %
Neutrophils Absolute: 2.3 10*3/uL (ref 1.4–7.0)
Neutrophils: 41 %
Platelets: 236 10*3/uL (ref 150–450)
RBC: 4.8 x10E6/uL (ref 3.77–5.28)
RDW: 15.2 % (ref 12.3–15.4)
WBC: 5.5 10*3/uL (ref 3.4–10.8)

## 2018-11-14 LAB — HEMOGLOBIN A1C
Est. average glucose Bld gHb Est-mCnc: 140 mg/dL
Hgb A1c MFr Bld: 6.5 % — ABNORMAL HIGH (ref 4.8–5.6)

## 2018-11-14 LAB — VITAMIN D 25 HYDROXY (VIT D DEFICIENCY, FRACTURES): VIT D 25 HYDROXY: 16.6 ng/mL — AB (ref 30.0–100.0)

## 2018-11-14 LAB — T4, FREE: FREE T4: 1.08 ng/dL (ref 0.82–1.77)

## 2018-11-14 LAB — T3: T3, Total: 119 ng/dL (ref 71–180)

## 2018-11-14 LAB — TSH: TSH: 2.18 u[IU]/mL (ref 0.450–4.500)

## 2018-11-18 NOTE — Progress Notes (Signed)
47 y.o. G56P3003 Married Black or [place] American Not Hispanic or Latino female here for annual exam.   H/O mild cystocele and rectocele, not symptomatic. No contraception since the birth of her last child. Sexually active, no pain. No vasomotor symptoms.   She c/o pain in her right chest wall above her breast. Only notices it when she touches it. Thinks its been present for the last few months.   Recent lab work with low vit d, HgbA1C at 6.5% (diabetes)   Period Cycle (Days): 28 Period Duration (Days): 5-6 days Period Pattern: Regular Menstrual Flow: Light, Moderate Menstrual Control: Thin pad Menstrual Control Change Freq (Hours): changes pad every 6 hours Dysmenorrhea: None  Patient's last menstrual period was 11/14/2018 (exact date).          Sexually active: Yes.    The current method of family planning is none.    Exercising: No.  The patient does not participate in regular exercise at present. Smoker:  no  Health Maintenance: Pap:  09-06-15 WNL NEG HR HPV  History of abnormal Pap:  no MMG:  10/21/2018 Birads 1 negative Colonoscopy:  Never BMD:   Never TDaP:  11/13/2018 Gardasil: N/A    reports that she has never smoked. She has never used smokeless tobacco. She reports that she does not drink alcohol or use drugs. 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 Oldest daughter is a Museum/gallery exhibitions officer in college at [employer].63 year old daughter graduates from [employer] this year. Son is 52.    Past Medical History:  Diagnosis Date  . Arthritis of knee   . Fall 1984  . Morbid obesity ([place])   . Prediabetes   . Sleep apnea   . Vitamin D deficiency     Past Surgical History:  Procedure Laterality Date  . CATARACT EXTRACTION  01/2012  . UMBILICAL HERNIA REPAIR  1996    Current Outpatient Medications  Medication Sig Dispense Refill  . Cholecalciferol (VITAMIN D PO) Take by mouth.     No current facility-administered medications for this visit.      Family History  Problem Relation Age of Onset  . Hypertension Mother   . Stroke Mother   . Diabetes Brother        died at age 94    Review of Systems  Constitutional: Negative.   HENT: Negative.   Eyes: Negative.   Respiratory:       Chest pain on left side close to axilla and breast  Cardiovascular: Negative.   Gastrointestinal: Negative.   Endocrine: Negative.   Genitourinary: Negative.   Musculoskeletal: Negative.   Skin: Negative.   Allergic/Immunologic: Negative.   Neurological: Negative.   Hematological: Negative.   Psychiatric/Behavioral: Negative.     Exam:   BP 114/78 (BP Location: Right Arm, Patient Position: Sitting, Cuff Size: Large)   Pulse 96   Ht 5\' 5"  (1.651 m)   Wt 254 lb (115.2 kg)   LMP 11/14/2018 (Exact Date)   BMI 42.27 kg/m   Weight change: @WEIGHTCHANGE @ Height:   Height: 5\' 5"  (165.1 cm)  Ht Readings from Last 3 Encounters:  11/24/18 5\' 5"  (1.651 m)  11/13/18 5' 5.25" (1.657 m)  10/31/17 5\' 5"  (1.651 m)    General appearance: alert, cooperative and appears stated age Head: Normocephalic, without obvious abnormality, atraumatic Neck: no adenopathy, supple, symmetrical, trachea midline and thyroid normal to inspection and palpation Lungs: clear to auscultation bilaterally Cardiovascular: regular rate and rhythm Breasts: normal appearance,  no masses or tenderness, area of tenderness is on her chest wall above her right breast Abdomen: soft, non-tender; non distended,  no masses,  no organomegaly Extremities: extremities normal, atraumatic, no cyanosis or edema Skin: Skin color, texture, turgor normal. No rashes or lesions Lymph nodes: Cervical, supraclavicular, and axillary nodes normal. No abnormal inguinal nodes palpated Neurologic: Grossly normal   Pelvic: External genitalia:  no lesions              Urethra:  normal appearing urethra with no masses, tenderness or lesions              Bartholins and Skenes: normal                  Vagina: normal appearing vagina with normal color and discharge, no lesions              Cervix: no lesions               Bimanual Exam:  Uterus:  normal size, contour, position, consistency, mobility, non-tender              Adnexa: no mass, fullness, tenderness               Rectovaginal: Confirms               Anus:  normal sphincter tone, no lesions  Chaperone was present for exam.  A:  Well Woman with normal exam  P:   Pap with hpv  Cologuard  Discussed breast self exam  Discussed calcium and vit D intake  Labs with primary  Mammogram UTD   Addendum: after the exam she mentioned that her HR has been high. Resting is typically in the 90's, sometimes higher. Just standing up her pulse was 122. She will monitor and f/u with her primary

## 2018-11-24 ENCOUNTER — Ambulatory Visit (INDEPENDENT_AMBULATORY_CARE_PROVIDER_SITE_OTHER): Payer: 59 | Admitting: Obstetrics and Gynecology

## 2018-11-24 ENCOUNTER — Other Ambulatory Visit (HOSPITAL_COMMUNITY)
Admission: RE | Admit: 2018-11-24 | Discharge: 2018-11-24 | Disposition: A | Discharge status: Home / Self Care | Payer: 59 | Source: Ambulatory Visit | Attending: Obstetrics and Gynecology | Admitting: Obstetrics and Gynecology

## 2018-11-24 ENCOUNTER — Other Ambulatory Visit: Payer: Self-pay

## 2018-11-24 ENCOUNTER — Encounter: Payer: Self-pay | Admitting: Obstetrics and Gynecology

## 2018-11-24 VITALS — BP 114/78 | HR 96 | Ht 65.0 in | Wt 254.0 lb

## 2018-11-24 DIAGNOSIS — Z124 Encounter for screening for malignant neoplasm of cervix: Secondary | ICD-10-CM | POA: Insufficient documentation

## 2018-11-24 DIAGNOSIS — Z1211 Encounter for screening for malignant neoplasm of colon: Secondary | ICD-10-CM | POA: Diagnosis not present

## 2018-11-24 DIAGNOSIS — Z01419 Encounter for gynecological examination (general) (routine) without abnormal findings: Secondary | ICD-10-CM | POA: Diagnosis not present

## 2018-11-24 NOTE — Patient Instructions (Signed)
EXERCISE AND DIET:  We recommended that you start or continue a regular exercise program for good health. Regular exercise means any activity that makes your heart beat faster and makes you sweat.  We recommend exercising at least 30 minutes per day at least 3 days a week, preferably 4 or 5.  We also recommend a diet low in fat and sugar.  Inactivity, poor dietary choices and obesity can cause diabetes, heart attack, stroke, and kidney damage, among others.    ALCOHOL AND SMOKING:  Women should limit their alcohol intake to no more than 7 drinks/beers/glasses of wine (combined, not each!) per week. Moderation of alcohol intake to this level decreases your risk of breast cancer and liver damage. And of course, no recreational drugs are part of a healthy lifestyle.  And absolutely no smoking or even second hand smoke. Most people know smoking can cause heart and lung diseases, but did you know it also contributes to weakening of your bones? Aging of your skin?  Yellowing of your teeth and nails?  CALCIUM AND VITAMIN D:  Adequate intake of calcium and Vitamin D are recommended.  The recommendations for exact amounts of these supplements seem to change often, but generally speaking 1,000 mg of calcium (between diet and supplement) and 800 units of Vitamin D per day seems prudent. Certain women may benefit from higher intake of Vitamin D.  If you are among these women, your doctor will have told you during your visit.    PAP SMEARS:  Pap smears, to check for cervical cancer or precancers,  have traditionally been done yearly, although recent scientific advances have shown that most women can have pap smears less often.  However, every woman still should have a physical exam from her gynecologist every year. It will include a breast check, inspection of the vulva and vagina to check for abnormal growths or skin changes, a visual exam of the cervix, and then an exam to evaluate the size and shape of the uterus and  ovaries.  And after 49 years of age, a rectal exam is indicated to check for rectal cancers. We will also provide age appropriate advice regarding health maintenance, like when you should have certain vaccines, screening for sexually transmitted diseases, bone density testing, colonoscopy, mammograms, etc.   MAMMOGRAMS:  All women over 40 years old should have a yearly mammogram. Many facilities now offer a "3D" mammogram, which may cost around $50 extra out of pocket. If possible,  we recommend you accept the option to have the 3D mammogram performed.  It both reduces the number of women who will be called back for extra views which then turn out to be normal, and it is better than the routine mammogram at detecting truly abnormal areas.    COLON CANCER SCREENING: Now recommend starting at age 45. At this time colonoscopy is not covered for routine screening until 50. There are take home tests that can be done between 45-49.   COLONOSCOPY:  Colonoscopy to screen for colon cancer is recommended for all women at age 50.  We know, you hate the idea of the prep.  We agree, BUT, having colon cancer and not knowing it is worse!!  Colon cancer so often starts as a polyp that can be seen and removed at colonscopy, which can quite literally save your life!  And if your first colonoscopy is normal and you have no family history of colon cancer, most women don't have to have it again for   10 years.  Once every ten years, you can do something that may end up saving your life, right?  We will be happy to help you get it scheduled when you are ready.  Be sure to check your insurance coverage so you understand how much it will cost.  It may be covered as a preventative service at no cost, but you should check your particular policy.      Breast Self-Awareness Breast self-awareness means being familiar with how your breasts look and feel. It involves checking your breasts regularly and reporting any changes to your  health care provider. Practicing breast self-awareness is important. A change in your breasts can be a sign of a serious medical problem. Being familiar with how your breasts look and feel allows you to find any problems early, when treatment is more likely to be successful. All women should practice breast self-awareness, including women who have had breast implants. How to do a breast self-exam One way to learn what is normal for your breasts and whether your breasts are changing is to do a breast self-exam. To do a breast self-exam: Look for Changes  1. Remove all the clothing above your waist. 2. Stand in front of a mirror in a room with good lighting. 3. Put your hands on your hips. 4. Push your hands firmly downward. 5. Compare your breasts in the mirror. Look for differences between them (asymmetry), such as: ? Differences in shape. ? Differences in size. ? Puckers, dips, and bumps in one breast and not the other. 6. Look at each breast for changes in your skin, such as: ? Redness. ? Scaly areas. 7. Look for changes in your nipples, such as: ? Discharge. ? Bleeding. ? Dimpling. ? Redness. ? A change in position. Feel for Changes Carefully feel your breasts for lumps and changes. It is best to do this while lying on your back on the floor and again while sitting or standing in the shower or tub with soapy water on your skin. Feel each breast in the following way:  Place the arm on the side of the breast you are examining above your head.  Feel your breast with the other hand.  Start in the nipple area and make  inch (2 cm) overlapping circles to feel your breast. Use the pads of your three middle fingers to do this. Apply light pressure, then medium pressure, then firm pressure. The light pressure will allow you to feel the tissue closest to the skin. The medium pressure will allow you to feel the tissue that is a little deeper. The firm pressure will allow you to feel the tissue  close to the ribs.  Continue the overlapping circles, moving downward over the breast until you feel your ribs below your breast.  Move one finger-width toward the center of the body. Continue to use the  inch (2 cm) overlapping circles to feel your breast as you move slowly up toward your collarbone.  Continue the up and down exam using all three pressures until you reach your armpit.  Write Down What You Find  Write down what is normal for each breast and any changes that you find. Keep a written record with breast changes or normal findings for each breast. By writing this information down, you do not need to depend only on memory for size, tenderness, or location. Write down where you are in your menstrual cycle, if you are still menstruating. If you are having trouble noticing differences   in your breasts, do not get discouraged. With time you will become more familiar with the variations in your breasts and more comfortable with the exam. How often should I examine my breasts? Examine your breasts every month. If you are breastfeeding, the best time to examine your breasts is after a feeding or after using a breast pump. If you menstruate, the best time to examine your breasts is 5-7 days after your period is over. During your period, your breasts are lumpier, and it may be more difficult to notice changes. When should I see my health care provider? See your health care provider if you notice:  A change in shape or size of your breasts or nipples.  A change in the skin of your breast or nipples, such as a reddened or scaly area.  Unusual discharge from your nipples.  A lump or thick area that was not there before.  Pain in your breasts.  Anything that concerns you.  

## 2018-11-26 LAB — CYTOLOGY - PAP
DIAGNOSIS: NEGATIVE
HPV: NOT DETECTED

## 2019-01-01 ENCOUNTER — Encounter: Payer: Self-pay | Admitting: Internal Medicine

## 2019-01-19 DIAGNOSIS — R221 Localized swelling, mass and lump, neck: Secondary | ICD-10-CM | POA: Diagnosis not present

## 2019-01-22 ENCOUNTER — Other Ambulatory Visit: Payer: Self-pay | Admitting: Otolaryngology

## 2019-01-22 DIAGNOSIS — R221 Localized swelling, mass and lump, neck: Secondary | ICD-10-CM

## 2019-01-28 ENCOUNTER — Other Ambulatory Visit: Payer: Self-pay | Admitting: Otolaryngology

## 2019-01-28 DIAGNOSIS — R221 Localized swelling, mass and lump, neck: Secondary | ICD-10-CM

## 2019-01-30 ENCOUNTER — Ambulatory Visit
Admission: RE | Admit: 2019-01-30 | Discharge: 2019-01-30 | Disposition: A | Discharge status: Home / Self Care | Payer: 59 | Source: Ambulatory Visit | Attending: Otolaryngology | Admitting: Otolaryngology

## 2019-01-30 DIAGNOSIS — R599 Enlarged lymph nodes, unspecified: Secondary | ICD-10-CM | POA: Diagnosis not present

## 2019-01-30 DIAGNOSIS — R221 Localized swelling, mass and lump, neck: Secondary | ICD-10-CM

## 2019-01-30 MED ORDER — IOPAMIDOL (ISOVUE-300) INJECTION 61%
75.0000 mL | Freq: Once | INTRAVENOUS | Status: AC | PRN
  Administered 2019-01-30: 75 mL via INTRAVENOUS

## 2019-07-02 ENCOUNTER — Ambulatory Visit (INDEPENDENT_AMBULATORY_CARE_PROVIDER_SITE_OTHER): Payer: 59 | Admitting: Family Medicine

## 2019-07-02 ENCOUNTER — Encounter: Payer: Self-pay | Admitting: Family Medicine

## 2019-07-02 ENCOUNTER — Other Ambulatory Visit: Payer: Self-pay

## 2019-07-02 VITALS — BP 120/80 | HR 104 | Temp 98.3°F | Ht 65.0 in | Wt 267.6 lb

## 2019-07-02 DIAGNOSIS — R0789 Other chest pain: Secondary | ICD-10-CM

## 2019-07-02 MED ORDER — IBUPROFEN 800 MG PO TABS: 800.0000 mg | ORAL_TABLET | Freq: Three times a day (TID) | ORAL | 0 refills | Status: DC | PRN

## 2019-07-02 NOTE — Patient Instructions (Signed)
Your pain appears to be related to a musculoskeletal issue or possibly related to your period.   Try taking 800 mg of ibuprofen twice daily over the next 3-4 days.   If you experience any new or worsening symptoms, let me know.  If you do not feel like this resolves in the next week or 2 then we should do more testing.

## 2019-07-02 NOTE — Progress Notes (Signed)
   Subjective:    Patient ID: Sonya Gallegos, female    DOB: 1971/04/25, 48 y.o.   MRN: [id number]  HPI Chief Complaint  Patient presents with  . Breast Pain    bilateral x1week    Here with complaints of a one-week history of bilateral lateral breast tissue pain versus chest wall pain.  The areas are tender to palpation.  Pain present with certain movements.  Does not wake her up at night.  Denies fever, chills, skin changes, nipple discharge.  She had a normal mammogram in December 2019.  Started her period yesterday.  Has not taken anything for the pain.   Reviewed allergies, medications, past medical, surgical, family, and social history.   Review of Systems Pertinent positives and negatives in the history of present illness.     Objective:   Physical Exam Exam conducted with a chaperone present.  Constitutional:      General: She is not in acute distress.    Appearance: Normal appearance. She is obese. She is not ill-appearing.  Cardiovascular:     Rate and Rhythm: Normal rate and regular rhythm.     Heart sounds: Normal heart sounds.  Pulmonary:     Effort: Pulmonary effort is normal.     Breath sounds: Normal breath sounds.  Chest:     Chest wall: Tenderness present. No mass, deformity, swelling or crepitus.     Breasts:        Right: Normal. No mass, nipple discharge or skin change.        Left: Normal. No mass, nipple discharge or skin change.       Comments: Bilateral chest wall/lateral breast with TTP. Pain reproduced by certain movements.  Lymphadenopathy:     Upper Body:     Right upper body: No supraclavicular or axillary adenopathy.     Left upper body: No supraclavicular or axillary adenopathy.  Skin:    General: Skin is warm and dry.  Neurological:     Mental Status: She is alert.    BP 120/80   Pulse (!) 104   Temp 98.3 F (36.8 C) (Oral)   Ht 5\' 5"  (1.651 m)   Wt 267 lb 9.6 oz (121.4 kg)   LMP 07/01/2019   SpO2 97%   BMI 44.53 kg/m        Assessment & Plan:  Pain, chest wall - Plan: ibuprofen (ADVIL) 800 MG tablet, no palpable breast masses or abnormalities suggesting need for imaging today. Discussed that her pain appears to be related to a MSK etiology. She will continue to keep an eye on her symptoms and let me know if not back to baseline in the next few days when her cycle is complete or if she notices any new or worsening symptoms. Consider mammogram/US if symptoms are not resolved soon.

## 2019-09-28 ENCOUNTER — Other Ambulatory Visit: Payer: Self-pay | Admitting: Obstetrics and Gynecology

## 2019-09-28 DIAGNOSIS — Z1231 Encounter for screening mammogram for malignant neoplasm of breast: Secondary | ICD-10-CM

## 2019-11-19 ENCOUNTER — Other Ambulatory Visit: Payer: Self-pay

## 2019-11-19 ENCOUNTER — Ambulatory Visit
Admission: RE | Admit: 2019-11-19 | Discharge: 2019-11-19 | Disposition: A | Discharge status: Home / Self Care | Payer: 59 | Source: Ambulatory Visit | Attending: Obstetrics and Gynecology | Admitting: Obstetrics and Gynecology

## 2019-11-19 DIAGNOSIS — Z1231 Encounter for screening mammogram for malignant neoplasm of breast: Secondary | ICD-10-CM

## 2019-11-27 ENCOUNTER — Encounter: Payer: 59 | Admitting: Family Medicine

## 2019-12-01 NOTE — Progress Notes (Signed)
49 y.o. G12P3003 Married Black or [place] American Not Hispanic or Latino female here for annual exam.  Wants to know why she is having irregular periods Patient states that her aunt had cervical caner   She skipped one cycle last year, Currently 6 days late, sometimes 10-11 days late. Ranging from 4-8, mainly every 4-6 weeks. No vasomotor symptoms.  Period Cycle (Days): 32 Period Duration (Days): 4 days Period Pattern: (!) Irregular Menstrual Flow: Moderate Menstrual Control: Hospital pad Menstrual Control Change Freq (Hours): every 4 hours Dysmenorrhea: (!) Mild Dysmenorrhea Symptoms: Headache, Cramping   Last year her HgbA1C was 6.5%, hasn't seen her primary about this. Would like labs drawn today.   She has a h/o a mild cystocele and rectocele, not symptomatic. Normal bowel and bladder function, no leakage.  No dyspareunia.   Patient's last menstrual period was 10/25/2019 (exact date).          Sexually active: Yes.    The current method of family planning is abstinence.    Exercising: Yes.    Walking Smoker:  no  Health Maintenance: Pap:  11/24/18 WNL, NEG HR HPV, 09-06-15 WNL NEG HR HPV History of abnormal Pap:  no MMG:  11/19/19 density B Birads 1 neg  BMD:   none Colonoscopy: none  TDaP:  11/13/18  Gardasil: na    reports that she has never smoked. She has never used smokeless tobacco. She reports that she does not drink alcohol or use drugs. 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 Oldest daughter is a sophmore in college at [employer].72 year old daughter graduated from [employer] last year, in college. Son is 46.    Past Medical History:  Diagnosis Date  . Arthritis of knee   . Fall 1984  . Morbid obesity ([place])   . Prediabetes   . Sleep apnea   . Vitamin D deficiency   Last year HgbA1C was 6.5  Past Surgical History:  Procedure Laterality Date  . CATARACT EXTRACTION  01/2012  . UMBILICAL HERNIA REPAIR  1996    Current Outpatient  Medications  Medication Sig Dispense Refill  . Cholecalciferol (VITAMIN D PO) Take by mouth.     No current facility-administered medications for this visit.    Family History  Problem Relation Age of Onset  . Hypertension Mother   . Stroke Mother   . Diabetes Brother        died at age 59    Review of Systems  Constitutional: Negative.   HENT: Negative.   Eyes: Negative.   Respiratory: Negative.   Cardiovascular: Negative.   Gastrointestinal: Negative.   Endocrine: Negative.   Genitourinary: Negative.   Musculoskeletal: Negative.   Skin: Negative.   Allergic/Immunologic: Negative.   Neurological: Negative.   Hematological: Negative.   Psychiatric/Behavioral: Negative.     Exam:   BP 110/78   Pulse (!) 109   Temp (!) 97.3 F (36.3 C)   Ht 5\' 5"  (1.651 m)   Wt 258 lb (117 kg)   LMP 10/25/2019 (Exact Date)   SpO2 97%   BMI 42.93 kg/m   Weight change: @WEIGHTCHANGE @ Height:   Height: 5\' 5"  (165.1 cm)  Ht Readings from Last 3 Encounters:  12/03/19 5\' 5"  (1.651 m)  07/02/19 5\' 5"  (1.651 m)  11/24/18 5\' 5"  (1.651 m)    General appearance: alert, cooperative and appears stated age Head: Normocephalic, without obvious abnormality, atraumatic Neck: no adenopathy, supple, symmetrical, trachea midline and thyroid normal to  inspection and palpation Lungs: clear to auscultation bilaterally Cardiovascular: regular rate and rhythm Breasts: normal appearance, no masses or tenderness Abdomen: soft, non-tender; non distended,  no masses,  no organomegaly Extremities: extremities normal, atraumatic, no cyanosis or edema Skin: Skin color, texture, turgor normal. No rashes or lesions Lymph nodes: Cervical, supraclavicular, and axillary nodes normal. No abnormal inguinal nodes palpated Neurologic: Grossly normal   Pelvic: External genitalia:  no lesions              Urethra:  normal appearing urethra with no masses, tenderness or lesions              Bartholins and Skenes:  normal                 Vagina: normal appearing vagina with normal color and discharge, no lesions              Cervix: cervical polyp, removed with ringed forceps.               Bimanual Exam:  Uterus:  normal size, contour, position, consistency, mobility, non-tender and anteverted              Adnexa: no mass, fullness, tenderness               Rectovaginal: Confirms               Anus:  normal sphincter tone, no lesions  Gae Dry chaperoned for the exam.  A:  Well Woman with normal exam  Perimenopausal, some cycle changes  H/O prediabetes, last hgba1c was in diabetic range (last year)  Cervical polyp removed  P:   No pap this year  Mammogram UTD  Cologuard ordered (she will check on coverage)  Screening labs, vit d, HgbA1C  Discussed breast self exam  Discussed calcium and vit D intake  Discussed exercise, weight loss

## 2019-12-03 ENCOUNTER — Other Ambulatory Visit (HOSPITAL_COMMUNITY)
Admission: RE | Admit: 2019-12-03 | Discharge: 2019-12-03 | Disposition: A | Discharge status: Home / Self Care | Payer: 59 | Source: Ambulatory Visit | Attending: Obstetrics and Gynecology | Admitting: Obstetrics and Gynecology

## 2019-12-03 ENCOUNTER — Ambulatory Visit (INDEPENDENT_AMBULATORY_CARE_PROVIDER_SITE_OTHER): Payer: 59 | Admitting: Obstetrics and Gynecology

## 2019-12-03 ENCOUNTER — Other Ambulatory Visit: Payer: Self-pay

## 2019-12-03 ENCOUNTER — Encounter: Payer: Self-pay | Admitting: Obstetrics and Gynecology

## 2019-12-03 VITALS — BP 110/78 | HR 109 | Temp 97.3°F | Ht 65.0 in | Wt 258.0 lb

## 2019-12-03 DIAGNOSIS — N841 Polyp of cervix uteri: Secondary | ICD-10-CM | POA: Diagnosis present

## 2019-12-03 DIAGNOSIS — Z0000 Encounter for general adult medical examination without abnormal findings: Secondary | ICD-10-CM | POA: Diagnosis not present

## 2019-12-03 DIAGNOSIS — Z6841 Body Mass Index (BMI) 40.0 and over, adult: Secondary | ICD-10-CM

## 2019-12-03 DIAGNOSIS — Z01419 Encounter for gynecological examination (general) (routine) without abnormal findings: Secondary | ICD-10-CM

## 2019-12-03 DIAGNOSIS — N951 Menopausal and female climacteric states: Secondary | ICD-10-CM

## 2019-12-03 DIAGNOSIS — Z1211 Encounter for screening for malignant neoplasm of colon: Secondary | ICD-10-CM

## 2019-12-03 DIAGNOSIS — R7989 Other specified abnormal findings of blood chemistry: Secondary | ICD-10-CM

## 2019-12-03 DIAGNOSIS — Z87898 Personal history of other specified conditions: Secondary | ICD-10-CM

## 2019-12-03 NOTE — Patient Instructions (Addendum)
If you go more than 8 weeks without a cycle, bleed closer than 3 weeks from the start of one cycle to the next, if you bleed for more than 7 days or if you saturate a pad in an hour then please call the office.    Check on coverage for cologuard  EXERCISE AND DIET:  We recommended that you start or continue a regular exercise program for good health. Regular exercise means any activity that makes your heart beat faster and makes you sweat.  We recommend exercising at least 30 minutes per day at least 3 days a week, preferably 4 or 5.  We also recommend a diet low in fat and sugar.  Inactivity, poor dietary choices and obesity can cause diabetes, heart attack, stroke, and kidney damage, among others.    ALCOHOL AND SMOKING:  Women should limit their alcohol intake to no more than 7 drinks/beers/glasses of wine (combined, not each!) per week. Moderation of alcohol intake to this level decreases your risk of breast cancer and liver damage. And of course, no recreational drugs are part of a healthy lifestyle.  And absolutely no smoking or even second hand smoke. Most people know smoking can cause heart and lung diseases, but did you know it also contributes to weakening of your bones? Aging of your skin?  Yellowing of your teeth and nails?  CALCIUM AND VITAMIN D:  Adequate intake of calcium and Vitamin D are recommended.  The recommendations for exact amounts of these supplements seem to change often, but generally speaking 1,000 mg of calcium (between diet and supplement) and 800 units of Vitamin D per day seems prudent. Certain women may benefit from higher intake of Vitamin D.  If you are among these women, your doctor will have told you during your visit.    PAP SMEARS:  Pap smears, to check for cervical cancer or precancers,  have traditionally been done yearly, although recent scientific advances have shown that most women can have pap smears less often.  However, every woman still should have a  physical exam from her gynecologist every year. It will include a breast check, inspection of the vulva and vagina to check for abnormal growths or skin changes, a visual exam of the cervix, and then an exam to evaluate the size and shape of the uterus and ovaries.  And after 50 years of age, a rectal exam is indicated to check for rectal cancers. We will also provide age appropriate advice regarding health maintenance, like when you should have certain vaccines, screening for sexually transmitted diseases, bone density testing, colonoscopy, mammograms, etc.   MAMMOGRAMS:  All women over 28 years old should have a yearly mammogram. Many facilities now offer a "3D" mammogram, which may cost around $50 extra out of pocket. If possible,  we recommend you accept the option to have the 3D mammogram performed.  It both reduces the number of women who will be called back for extra views which then turn out to be normal, and it is better than the routine mammogram at detecting truly abnormal areas.    COLON CANCER SCREENING: Now recommend starting at age 66. At this time colonoscopy is not covered for routine screening until 50. There are take home tests that can be done between 45-49.   COLONOSCOPY:  Colonoscopy to screen for colon cancer is recommended for all women at age 108.  We know, you hate the idea of the prep.  We agree, BUT, having colon cancer and  not knowing it is worse!!  Colon cancer so often starts as a polyp that can be seen and removed at colonscopy, which can quite literally save your life!  And if your first colonoscopy is normal and you have no family history of colon cancer, most women don't have to have it again for 10 years.  Once every ten years, you can do something that may end up saving your life, right?  We will be happy to help you get it scheduled when you are ready.  Be sure to check your insurance coverage so you understand how much it will cost.  It may be covered as a preventative  service at no cost, but you should check your particular policy.      Breast Self-Awareness Breast self-awareness means being familiar with how your breasts look and feel. It involves checking your breasts regularly and reporting any changes to your health care provider. Practicing breast self-awareness is important. A change in your breasts can be a sign of a serious medical problem. Being familiar with how your breasts look and feel allows you to find any problems early, when treatment is more likely to be successful. All women should practice breast self-awareness, including women who have had breast implants. How to do a breast self-exam One way to learn what is normal for your breasts and whether your breasts are changing is to do a breast self-exam. To do a breast self-exam: Look for Changes  1. Remove all the clothing above your waist. 2. Stand in front of a mirror in a room with good lighting. 3. Put your hands on your hips. 4. Push your hands firmly downward. 5. Compare your breasts in the mirror. Look for differences between them (asymmetry), such as: ? Differences in shape. ? Differences in size. ? Puckers, dips, and bumps in one breast and not the other. 6. Look at each breast for changes in your skin, such as: ? Redness. ? Scaly areas. 7. Look for changes in your nipples, such as: ? Discharge. ? Bleeding. ? Dimpling. ? Redness. ? A change in position. Feel for Changes Carefully feel your breasts for lumps and changes. It is best to do this while lying on your back on the floor and again while sitting or standing in the shower or tub with soapy water on your skin. Feel each breast in the following way:  Place the arm on the side of the breast you are examining above your head.  Feel your breast with the other hand.  Start in the nipple area and make  inch (2 cm) overlapping circles to feel your breast. Use the pads of your three middle fingers to do this. Apply light  pressure, then medium pressure, then firm pressure. The light pressure will allow you to feel the tissue closest to the skin. The medium pressure will allow you to feel the tissue that is a little deeper. The firm pressure will allow you to feel the tissue close to the ribs.  Continue the overlapping circles, moving downward over the breast until you feel your ribs below your breast.  Move one finger-width toward the center of the body. Continue to use the  inch (2 cm) overlapping circles to feel your breast as you move slowly up toward your collarbone.  Continue the up and down exam using all three pressures until you reach your armpit.  Write Down What You Find  Write down what is normal for each breast and any changes that  you find. Keep a written record with breast changes or normal findings for each breast. By writing this information down, you do not need to depend only on memory for size, tenderness, or location. Write down where you are in your menstrual cycle, if you are still menstruating. If you are having trouble noticing differences in your breasts, do not get discouraged. With time you will become more familiar with the variations in your breasts and more comfortable with the exam. How often should I examine my breasts? Examine your breasts every month. If you are breastfeeding, the best time to examine your breasts is after a feeding or after using a breast pump. If you menstruate, the best time to examine your breasts is 5-7 days after your period is over. During your period, your breasts are lumpier, and it may be more difficult to notice changes. When should I see my health care provider? See your health care provider if you notice:  A change in shape or size of your breasts or nipples.  A change in the skin of your breast or nipples, such as a reddened or scaly area.  Unusual discharge from your nipples.  A lump or thick area that was not there before.  Pain in your  breasts.  Anything that concerns you.  Perimenopause  Perimenopause is the normal time of life before and after menstrual periods stop completely (menopause). Perimenopause can begin 2-8 years before menopause, and it usually lasts for 1 year after menopause. During perimenopause, the ovaries may or may not produce an egg. What are the causes? This condition is caused by a natural change in hormone levels that happens as you get older. What increases the risk? This condition is more likely to start at an earlier age if you have certain medical conditions or treatments, including:  A tumor of the pituitary gland in the brain.  A disease that affects the ovaries and hormone production.  Radiation treatment for cancer.  Certain cancer treatments, such as chemotherapy or hormone (anti-estrogen) therapy.  Heavy smoking and excessive alcohol use.  Family history of early menopause. What are the signs or symptoms? Perimenopausal changes affect each woman differently. Symptoms of this condition may include:  Hot flashes.  Night sweats.  Irregular menstrual periods.  Decreased sex drive.  Vaginal dryness.  Headaches.  Mood swings.  Depression.  Memory problems or trouble concentrating.  Irritability.  Tiredness.  Weight gain.  Anxiety.  Trouble getting pregnant. How is this diagnosed? This condition is diagnosed based on your medical history, a physical exam, your age, your menstrual history, and your symptoms. Hormone tests may also be done. How is this treated? In some cases, no treatment is needed. You and your health care provider should make a decision together about whether treatment is necessary. Treatment will be based on your individual condition and preferences. Various treatments are available, such as:  Menopausal hormone therapy (MHT).  Medicines to treat specific symptoms.  Acupuncture.  Vitamin or herbal supplements. Before starting treatment,  make sure to let your health care provider know if you have a personal or family history of:  Heart disease.  Breast cancer.  Blood clots.  Diabetes.  Osteoporosis. Follow these instructions at home: Lifestyle  Do not use any products that contain nicotine or tobacco, such as cigarettes and e-cigarettes. If you need help quitting, ask your health care provider.  Eat a balanced diet that includes fresh fruits and vegetables, whole grains, soybeans, eggs, lean meat, and low-fat dairy.  Get at least 30 minutes of physical activity on 5 or more days each week.  Avoid alcoholic and caffeinated beverages, as well as spicy foods. This may help prevent hot flashes.  Get 7-8 hours of sleep each night.  Dress in layers that can be removed to help you manage hot flashes.  Find ways to manage stress, such as deep breathing, meditation, or journaling. General instructions  Keep track of your menstrual periods, including: ? When they occur. ? How heavy they are and how long they last. ? How much time passes between periods.  Keep track of your symptoms, noting when they start, how often you have them, and how long they last.  Take over-the-counter and prescription medicines only as told by your health care provider.  Take vitamin supplements only as told by your health care provider. These may include calcium, vitamin E, and vitamin D.  Use vaginal lubricants or moisturizers to help with vaginal dryness and improve comfort during sex.  Talk with your health care provider before starting any herbal supplements.  Keep all follow-up visits as told by your health care provider. This is important. This includes any group therapy or counseling. Contact a health care provider if:  You have heavy vaginal bleeding or pass blood clots.  Your period lasts more than 2 days longer than normal.  Your periods are recurring sooner than 21 days.  You bleed after having sex. Get help right away  if:  You have chest pain, trouble breathing, or trouble talking.  You have severe depression.  You have pain when you urinate.  You have severe headaches.  You have vision problems. Summary  Perimenopause is the time when a woman's body begins to move into menopause. This may happen naturally or as a result of other health problems or medical treatments.  Perimenopause can begin 2-8 years before menopause, and it usually lasts for 1 year after menopause.  Perimenopausal symptoms can be managed through medicines, lifestyle changes, and complementary therapies such as acupuncture. This information is not intended to replace advice given to you by your health care provider. Make sure you discuss any questions you have with your health care provider. Document Revised: 10/18/2017 Document Reviewed: 12/11/2016 Elsevier Patient Education  2020 [employer].

## 2019-12-04 LAB — HEMOGLOBIN A1C
Est. average glucose Bld gHb Est-mCnc: 148 mg/dL
Hgb A1c MFr Bld: 6.8 % — ABNORMAL HIGH (ref 4.8–5.6)

## 2019-12-04 LAB — LIPID PANEL
Chol/HDL Ratio: 3 ratio (ref 0.0–4.4)
Cholesterol, Total: 207 mg/dL — ABNORMAL HIGH (ref 100–199)
HDL: 68 mg/dL (ref >39)
LDL Chol Calc (NIH): 126 mg/dL — ABNORMAL HIGH (ref 0–99)
Triglycerides: 74 mg/dL (ref 0–149)
VLDL Cholesterol Cal: 13 mg/dL (ref 5–40)

## 2019-12-04 LAB — VITAMIN D 25 HYDROXY (VIT D DEFICIENCY, FRACTURES): Vit D, 25-Hydroxy: 17.4 ng/mL — ABNORMAL LOW (ref 30.0–100.0)

## 2019-12-04 LAB — CBC
Hematocrit: 37.3 % (ref 34.0–46.6)
Hemoglobin: 11.5 g/dL (ref 11.1–15.9)
MCH: 23.5 pg — ABNORMAL LOW (ref 26.6–33.0)
MCHC: 30.8 g/dL — ABNORMAL LOW (ref 31.5–35.7)
MCV: 76 fL — ABNORMAL LOW (ref 79–97)
Platelets: 280 10*3/uL (ref 150–450)
RBC: 4.89 x10E6/uL (ref 3.77–5.28)
RDW: 15.7 % — ABNORMAL HIGH (ref 11.7–15.4)
WBC: 6.3 10*3/uL (ref 3.4–10.8)

## 2019-12-04 LAB — COMPREHENSIVE METABOLIC PANEL
ALT: 12 IU/L (ref 0–32)
AST: 15 IU/L (ref 0–40)
Albumin/Globulin Ratio: 1.6 (ref 1.2–2.2)
Albumin: 4.3 g/dL (ref 3.8–4.8)
Alkaline Phosphatase: 45 IU/L (ref 39–117)
BUN/Creatinine Ratio: 16 (ref 9–23)
BUN: 11 mg/dL (ref 6–24)
Bilirubin Total: 0.3 mg/dL (ref 0.0–1.2)
CO2: 24 mmol/L (ref 20–29)
Calcium: 10 mg/dL (ref 8.7–10.2)
Chloride: 104 mmol/L (ref 96–106)
Creatinine, Ser: 0.67 mg/dL (ref 0.57–1.00)
GFR calc Af Amer: 119 mL/min/{1.73_m2} (ref >59)
GFR calc non Af Amer: 104 mL/min/{1.73_m2} (ref >59)
Globulin, Total: 2.7 g/dL (ref 1.5–4.5)
Glucose: 96 mg/dL (ref 65–99)
Potassium: 4 mmol/L (ref 3.5–5.2)
Sodium: 138 mmol/L (ref 134–144)
Total Protein: 7 g/dL (ref 6.0–8.5)

## 2019-12-04 LAB — TSH: TSH: 2.18 u[IU]/mL (ref 0.450–4.500)

## 2019-12-07 LAB — SURGICAL PATHOLOGY

## 2019-12-11 ENCOUNTER — Encounter: Payer: 59 | Admitting: Family Medicine

## 2020-01-08 ENCOUNTER — Other Ambulatory Visit: Payer: Self-pay

## 2020-01-08 ENCOUNTER — Ambulatory Visit (INDEPENDENT_AMBULATORY_CARE_PROVIDER_SITE_OTHER): Payer: 59 | Admitting: Family Medicine

## 2020-01-08 ENCOUNTER — Encounter: Payer: Self-pay | Admitting: Family Medicine

## 2020-01-08 VITALS — BP 118/80 | HR 88 | Temp 97.5°F | Ht 65.25 in | Wt 260.8 lb

## 2020-01-08 DIAGNOSIS — E785 Hyperlipidemia, unspecified: Secondary | ICD-10-CM

## 2020-01-08 DIAGNOSIS — E7800 Pure hypercholesterolemia, unspecified: Secondary | ICD-10-CM | POA: Insufficient documentation

## 2020-01-08 DIAGNOSIS — Z7185 Encounter for immunization safety counseling: Secondary | ICD-10-CM

## 2020-01-08 DIAGNOSIS — Z7189 Other specified counseling: Secondary | ICD-10-CM | POA: Diagnosis not present

## 2020-01-08 DIAGNOSIS — Z0000 Encounter for general adult medical examination without abnormal findings: Secondary | ICD-10-CM

## 2020-01-08 DIAGNOSIS — E119 Type 2 diabetes mellitus without complications: Secondary | ICD-10-CM | POA: Diagnosis not present

## 2020-01-08 DIAGNOSIS — E1169 Type 2 diabetes mellitus with other specified complication: Secondary | ICD-10-CM

## 2020-01-08 DIAGNOSIS — E559 Vitamin D deficiency, unspecified: Secondary | ICD-10-CM

## 2020-01-08 DIAGNOSIS — Z8669 Personal history of other diseases of the nervous system and sense organs: Secondary | ICD-10-CM

## 2020-01-08 MED ORDER — ATORVASTATIN CALCIUM 10 MG PO TABS: 10.0000 mg | ORAL_TABLET | Freq: Every day | ORAL | 1 refills | Status: DC

## 2020-01-08 NOTE — Progress Notes (Signed)
Subjective:    Patient ID: Sonya Gallegos, female    DOB: 01-12-1971, 49 y.o.   MRN: [id number]  HPI Chief Complaint  Patient presents with  . other    fasting CPE   She is here for a complete physical exam and to follow up on chronic health conditions.  Other providers: Dr. Talbert Nan- OB/GYN Eye doctor   History of prediabetes but last month at her gynecologist visit, her A1c was checked as well as other labs.  Hgb A1c was 6.8%. she was not aware that she has diabetes and it surprised.    OSA- states she was diagnosed many years ago and was given a CPAP. She tried it and never used it. Does not want to address this.   Vitamin D deficiency- is not currently taking vitamin D daily now. Vitamin D was low last month at her OB/GYN   Social history: Lives with husband, 2 daughters and son, works as med Designer, multimedia in [place]  Denies smoking, drinking alcohol, drug use  Diet: rice, yams, bread. Heavy in carbs and soda Excerise: nothing   Immunizations: received Covid vaccine last week. Needs pneumonia vaccine at some point in the future.   Health maintenance:  Mammogram: 11/18/2020 Colonoscopy: Last Gynecological Exam: 12/03/2019 Last Dental Exam: last month  Last Eye Exam: 2019  Wears seatbelt always, smoke detectors in home and functioning, does not text while driving and feels safe in home environment.   Reviewed allergies, medications, past medical, surgical, family, and social history.   Review of Systems Review of Systems Constitutional: -fever, -chills, -sweats, -unexpected weight change,-fatigue ENT: -runny nose, -ear pain, -sore throat Cardiology:  -chest pain, -palpitations, -edema Respiratory: -cough, -shortness of breath, -wheezing Gastroenterology: -abdominal pain, -nausea, -vomiting, -diarrhea, -constipation  Hematology: -bleeding or bruising problems Musculoskeletal: -arthralgias, -myalgias, -joint swelling, -back pain Ophthalmology: -vision changes Urology:  -dysuria, -difficulty urinating, -hematuria, -urinary frequency, -urgency Neurology: -headache, -weakness, -tingling, -numbness       Objective:   Physical Exam BP 118/80 (BP Location: Left Arm, Patient Position: Sitting)   Pulse 88   Temp (!) 97.5 F (36.4 C)   Ht 5' 5.25" (1.657 m)   Wt 260 lb 12.8 oz (118.3 kg)   LMP 01/05/2020   SpO2 97%   BMI 43.07 kg/m   General Appearance:    Alert, cooperative, no distress, appears stated age  Head:    Normocephalic, without obvious abnormality, atraumatic  Eyes:    PERRL, conjunctiva/corneas clear, EOM's intact, fundi    benign  Ears:    Normal TM's and external ear canals  Nose:   Mask in place   Throat:   Mask in place   Neck:   Supple, no lymphadenopathy;  thyroid:  no   enlargement/tenderness/nodules; no JVD. Area of fullness under her chin on the left that is chronic and unchanged. Non tender  Back:    Spine nontender, no curvature, ROM normal, no CVA     tenderness  Lungs:     Clear to auscultation bilaterally without wheezes, rales or     ronchi; respirations unlabored  Chest Wall:    No tenderness or deformity   Heart:    Regular rate and rhythm, S1 and S2 normal, no murmur, rub   or gallop  Breast Exam:    OB/GYN  Abdomen:     Soft, non-tender, nondistended, normoactive bowel sounds,    no masses, no hepatosplenomegaly  Genitalia:    OB/GYN     Extremities:   No clubbing,  cyanosis or edema  Pulses:   2+ and symmetric all extremities  Skin:   Skin color, texture, turgor normal, no rashes or lesions  Lymph nodes:   Cervical, supraclavicular, and axillary nodes normal  Neurologic:   CNII-XII intact, normal strength, sensation and gait          Psych:   Normal mood, affect, hygiene and grooming.        Assessment & Plan:  Routine general medical examination at a health care facility -She is here today for a CPE.  She had labs done last month at her OB/GYN office which we reviewed. Discussed preventive healthcare and she  is up-to-date on mammogram and Pap smear.  States her OB/GYN referred her to GI for a possible screening colonoscopy. Discussed safety and health promotion. Counseling on healthy lifestyle including diet and exercise.  Diabetes mellitus, new onset ([place]) -Hemoglobin A1c checked at her OB/GYN office last month showed that she now has diabetes.  A1c 6.8%.  In-depth counseling on diabetes and associated health risks, how to manage diabetes and standards of care.  She was surprised that she had this diagnosis and expressed that she felt overwhelmed.  She declines to start medication and would prefer to try a 40-month.  Of improving diet and exercise.  States she can cut back on her carbohydrates and soda.  Encouraged her to increase her physical activity.  Initially she declined a meter and supplies to test her blood sugar at home.  By the end of the visit she was provided a meter in case she decides to check her blood sugars. Discussed standards of care include being on an ACE inhibitor or an ARB and a statin.  She agrees to start taking a statin daily but refuses an ACE or an ARB.  Encouraged diabetic eye exam. She will follow-up in 3 months or sooner if needed  Morbid obesity ([place]) -Counseling on diet and exercise and potential long-term health consequences associated with morbid obesity.  Vitamin D deficiency -She declines prescription strength vitamin D and will start taking 2000 IUs of vitamin D3 over-the-counter daily.  Encouraged her to set a reminder on her phone since she has been having issues remembering to take this  History of sleep apnea -She does not want to discuss this today and states she will not wear a CPAP.  Hyperlipidemia associated with type 2 diabetes mellitus ([place]) - Plan: atorvastatin (LIPITOR) 10 MG tablet -Start on statin therapy.  Counseling on potential side effects  Immunization counseling -Discussed that she should get a pneumonia vaccine in the near future due to  diabetes.  She is 1 week from getting her first Covid vaccine and will need the second 1 in 3 weeks.  Advised her to avoid any other vaccines within 2 weeks of the Covid vaccine

## 2020-01-08 NOTE — Patient Instructions (Addendum)
Your hemoglobin A1c is 6.8% and this is considered diabetes.  I recommend you cut back on carbohydrates such as rice, bread, yams, potatoes, pasta and sugar such as soda Increase your physical activity  I will send in a medication to help lower your cholesterol called atorvastatin.  Take this once daily and if you decide to take it less often, let me know.  Follow-up in 3 months fasting     Preventive Care 50-50 Years Old, Female Preventive care refers to visits with your health care provider and lifestyle choices that can promote health and wellness. This includes:  A yearly physical exam. This may also be called an annual well check.  Regular dental visits and eye exams.  Immunizations.  Screening for certain conditions.  Healthy lifestyle choices, such as eating a healthy diet, getting regular exercise, not using drugs or products that contain nicotine and tobacco, and limiting alcohol use. What can I expect for my preventive care visit? Physical exam Your health care provider will check your:  Height and weight. This may be used to calculate body mass index (BMI), which tells if you are at a healthy weight.  Heart rate and blood pressure.  Skin for abnormal spots. Counseling Your health care provider may ask you questions about your:  Alcohol, tobacco, and drug use.  Emotional well-being.  Home and relationship well-being.  Sexual activity.  Eating habits.  Work and work Statistician.  Method of birth control.  Menstrual cycle.  Pregnancy history. What immunizations do I need?  Influenza (flu) vaccine  This is recommended every year. Tetanus, diphtheria, and pertussis (Tdap) vaccine  You may need a Td booster every 10 years. Varicella (chickenpox) vaccine  You may need this if you have not been vaccinated. Zoster (shingles) vaccine  You may need this after age 11. Measles, mumps, and rubella (MMR) vaccine  You may need at least one dose of MMR  if you were born in 1957 or later. You may also need a second dose. Pneumococcal conjugate (PCV13) vaccine  You may need this if you have certain conditions and were not previously vaccinated. Pneumococcal polysaccharide (PPSV23) vaccine  You may need one or two doses if you smoke cigarettes or if you have certain conditions. Meningococcal conjugate (MenACWY) vaccine  You may need this if you have certain conditions. Hepatitis A vaccine  You may need this if you have certain conditions or if you travel or work in places where you may be exposed to hepatitis A. Hepatitis B vaccine  You may need this if you have certain conditions or if you travel or work in places where you may be exposed to hepatitis B. Haemophilus influenzae type b (Hib) vaccine  You may need this if you have certain conditions. Human papillomavirus (HPV) vaccine  If recommended by your health care provider, you may need three doses over 6 months. You may receive vaccines as individual doses or as more than one vaccine together in one shot (combination vaccines). Talk with your health care provider about the risks and benefits of combination vaccines. What tests do I need? Blood tests  Lipid and cholesterol levels. These may be checked every 5 years, or more frequently if you are over 4 years old.  Hepatitis C test.  Hepatitis B test. Screening  Lung cancer screening. You may have this screening every year starting at age 86 if you have a 30-pack-year history of smoking and currently smoke or have quit within the past 15 years.  Colorectal  cancer screening. All adults should have this screening starting at age 43 and continuing until age 88. Your health care provider may recommend screening at age 9 if you are at increased risk. You will have tests every 1-10 years, depending on your results and the type of screening test.  Diabetes screening. This is done by checking your blood sugar (glucose) after you have  not eaten for a while (fasting). You may have this done every 1-3 years.  Mammogram. This may be done every 1-2 years. Talk with your health care provider about when you should start having regular mammograms. This may depend on whether you have a family history of breast cancer.  BRCA-related cancer screening. This may be done if you have a family history of breast, ovarian, tubal, or peritoneal cancers.  Pelvic exam and Pap test. This may be done every 3 years starting at age 25. Starting at age 24, this may be done every 5 years if you have a Pap test in combination with an HPV test. Other tests  Sexually transmitted disease (STD) testing.  Bone density scan. This is done to screen for osteoporosis. You may have this scan if you are at high risk for osteoporosis. Follow these instructions at home: Eating and drinking  Eat a diet that includes fresh fruits and vegetables, whole grains, lean protein, and low-fat dairy.  Take vitamin and mineral supplements as recommended by your health care provider.  Do not drink alcohol if: ? Your health care provider tells you not to drink. ? You are pregnant, may be pregnant, or are planning to become pregnant.  If you drink alcohol: ? Limit how much you have to 0-1 drink a day. ? Be aware of how much alcohol is in your drink. In the [place] one drink equals one 12 oz bottle of beer (355 mL), one 5 oz glass of wine (148 mL), or one 1 oz glass of hard liquor (44 mL). Lifestyle  Take daily care of your teeth and gums.  Stay active. Exercise for at least 30 minutes on 5 or more days each week.  Do not use any products that contain nicotine or tobacco, such as cigarettes, e-cigarettes, and chewing tobacco. If you need help quitting, ask your health care provider.  If you are sexually active, practice safe sex. Use a condom or other form of birth control (contraception) in order to prevent pregnancy and STIs (sexually transmitted infections).  If  told by your health care provider, take low-dose aspirin daily starting at age 53. What's next?  Visit your health care provider once a year for a well check visit.  Ask your health care provider how often you should have your eyes and teeth checked.  Stay up to date on all vaccines. This information is not intended to replace advice given to you by your health care provider. Make sure you discuss any questions you have with your health care provider. Document Revised: 07/17/2018 Document Reviewed: 07/17/2018 Elsevier Patient Education  2020 [employer].

## 2020-04-10 NOTE — Progress Notes (Deleted)
  Subjective:    Patient ID: Sonya Gallegos, female    DOB: [birth year], 49 y.o.   MRN: [id number]  Sonya Gallegos is a 49 y.o. female who presents for follow-up of Type 2 diabetes mellitus.  Patient {is/are not:32546} checking home blood sugars.   Home blood sugar records: {dm home sugars:14018} How often is blood sugars being checked: *** Current symptoms include: {dm sx:14075}. Patient denies {dm sx:19199}.  Patient {is/are not:32546} checking their feet daily. Any Foot concerns (callous, ulcer, wound, thickened nails, toenail fungus, skin fungus, hammer toe): *** Last dilated eye exam: ***  Current treatments: {dm interventions:14074}. Medication compliance: {good/fair/poor:33178}  Current diet: {diet habits:16563} Current exercise: {exercise types:16438} Known diabetic complications: {diabetes complications:1215}  The following portions of the patient's history were reviewed and updated as appropriate: allergies, current medications, past medical history, past social history and problem list.  ROS as in subjective above.     Objective:    Physical Exam Alert and in no distress otherwise not examined.  There were no vitals taken for this visit.  Lab Review Diabetic Labs Latest Ref Rng & Units 12/03/2019 11/13/2018 10/31/2017 09/14/2016 09/06/2016  HbA1c 4.8 - 5.6 % 6.8(H) 6.5(H) 6.5(H) 6.1(H) -  Chol 100 - 199 mg/dL 207(H) 187 197 196 -  HDL >39 mg/dL 68 65 74 76 -  Calc LDL 0 - 99 mg/dL 126(H) 110(H) 79 108 -  Triglycerides 0 - 149 mg/dL 74 62 218(H) 60 -  Creatinine 0.57 - 1.00 mg/dL 0.67 0.69 0.73 0.67 0.59   BP/Weight 01/08/2020 12/03/2019 07/02/2019 11/24/2018 [id number]  Systolic BP 123456 [id number] 123456 [id number] [id number]  Diastolic BP 80 78 80 78 70  Wt. (Lbs) 260.8 258 267.6 254 255.6  BMI 43.07 42.93 44.53 42.27 42.21   No flowsheet data found.  Sonya Gallegos  reports that she has never smoked. She has never used smokeless tobacco. She reports that she does not drink alcohol or use  drugs.     Assessment & Plan:    Diabetes mellitus, new onset ([place])  Vitamin D deficiency  Hyperlipidemia associated with type 2 diabetes mellitus ([place])  1. Rx changes: {none:33079} 2. Education: Reviewed 'ABCs' of diabetes management (respective goals in parentheses):  A1C (<7), blood pressure (<130/80), and cholesterol (LDL <100). 3. Compliance at present is estimated to be {good/fair/poor:33178}. Efforts to improve compliance (if necessary) will be directed at {compliance:16716}. 4. Follow up: {NUMBERS; 0-10:33138} {time:11}

## 2020-04-11 ENCOUNTER — Ambulatory Visit: Payer: 59 | Admitting: Family Medicine

## 2020-04-12 ENCOUNTER — Encounter: Payer: Self-pay | Admitting: Family Medicine

## 2020-05-30 ENCOUNTER — Other Ambulatory Visit: Payer: Self-pay | Admitting: Family Medicine

## 2020-05-30 ENCOUNTER — Other Ambulatory Visit: Payer: Self-pay

## 2020-05-30 ENCOUNTER — Ambulatory Visit: Payer: Self-pay

## 2020-05-30 DIAGNOSIS — M25511 Pain in right shoulder: Secondary | ICD-10-CM

## 2020-09-21 ENCOUNTER — Other Ambulatory Visit: Payer: Self-pay | Admitting: Obstetrics and Gynecology

## 2020-09-21 ENCOUNTER — Other Ambulatory Visit: Payer: Self-pay

## 2020-09-21 DIAGNOSIS — Z1231 Encounter for screening mammogram for malignant neoplasm of breast: Secondary | ICD-10-CM

## 2020-11-22 ENCOUNTER — Ambulatory Visit
Admission: RE | Admit: 2020-11-22 | Discharge: 2020-11-22 | Disposition: A | Discharge status: Home / Self Care | Payer: 59 | Source: Ambulatory Visit | Attending: Obstetrics and Gynecology | Admitting: Obstetrics and Gynecology

## 2020-11-22 ENCOUNTER — Other Ambulatory Visit: Payer: Self-pay

## 2020-11-22 DIAGNOSIS — Z1231 Encounter for screening mammogram for malignant neoplasm of breast: Secondary | ICD-10-CM

## 2020-12-06 ENCOUNTER — Ambulatory Visit (INDEPENDENT_AMBULATORY_CARE_PROVIDER_SITE_OTHER): Payer: 59 | Admitting: Obstetrics and Gynecology

## 2020-12-06 ENCOUNTER — Encounter: Payer: Self-pay | Admitting: Obstetrics and Gynecology

## 2020-12-06 ENCOUNTER — Other Ambulatory Visit: Payer: Self-pay

## 2020-12-06 VITALS — BP 126/78 | HR 90 | Ht 65.0 in | Wt 271.8 lb

## 2020-12-06 DIAGNOSIS — Z01419 Encounter for gynecological examination (general) (routine) without abnormal findings: Secondary | ICD-10-CM

## 2020-12-06 DIAGNOSIS — Z1211 Encounter for screening for malignant neoplasm of colon: Secondary | ICD-10-CM

## 2020-12-06 NOTE — Progress Notes (Signed)
50 y.o. G61P3003 Married [place] American female here for annual exam.   Patient wants to discuss cologuard. Never received kit last year.   Her last menses was uncomfortable. She felt something warm inside her abdomen.  She is expecting her menses now, which is monthly.  Flow can be heavy or normal.   She will see her PCP this week.   11, 94 and 80 yo children.   PCP: Harland Dingwall, NP-C    Patient's last menstrual period was 11/09/2020 (exact date).           Sexually active: Yes.     The current method of family planning is none    Exercising: Yes.    walking Smoker:  no  Health Maintenance: Pap: 11-24-18 Neg:Neg HR HPV, 09-06-15 Neg:neg HR HPV History of abnormal Pap:  no MMG:  11-22-20 3D/Neg/density B/BiRads1 Colonoscopy:  NEVER BMD:   n/a  Result  n/a TDaP:  11-13-18 Gardasil:   no HIV: Neg in Past Hep C: no Screening Labs:  PCP   reports that she has never smoked. She has never used smokeless tobacco. She reports that she does not drink alcohol and does not use drugs.  Past Medical History:  Diagnosis Date   Arthritis of knee    Diabetes mellitus ([place])    new onset 01//2021 with Hgb A1c 6.8%   Elevated LDL cholesterol level    Fall 1984   Morbid obesity ([place])    Sleep apnea    Vitamin D deficiency     Past Surgical History:  Procedure Laterality Date   CATARACT EXTRACTION  03/7321   UMBILICAL HERNIA REPAIR  1996    No current outpatient medications on file.   No current facility-administered medications for this visit.    Family History  Problem Relation Age of Onset   Hypertension Mother    Stroke Mother    Diabetes Brother        died at age 27   Cancer Maternal Aunt        Cervical Cancer    Review of Systems  All other systems reviewed and are negative.   Exam:   BP 126/78 (Cuff Size: Large)    Pulse 90    Ht 5' 5"  (1.651 m)    Wt 271 lb 12.8 oz (123.3 kg)    LMP 11/09/2020 (Exact Date)    SpO2 97%    BMI 45.23 kg/m      General appearance: alert, cooperative and appears stated age Head: normocephalic, without obvious abnormality, atraumatic Neck: no adenopathy, supple, symmetrical, trachea midline and thyroid normal to inspection and palpation Lungs: clear to auscultation bilaterally Breasts: normal appearance, no masses or tenderness, No nipple retraction or dimpling, No nipple discharge or bleeding, No axillary adenopathy Heart: regular rate and rhythm Abdomen: soft, non-tender; no masses, no organomegaly Extremities: extremities normal, atraumatic, no cyanosis or edema Skin: skin color, texture, turgor normal. No rashes or lesions Lymph nodes: cervical, supraclavicular, and axillary nodes normal. Neurologic: grossly normal  Pelvic: External genitalia:  no lesions              No abnormal inguinal nodes palpated.              Urethra:  normal appearing urethra with no masses, tenderness or lesions              Bartholins and Skenes: normal                 Vagina: normal appearing  vagina with normal color and discharge, no lesions              Cervix: no lesions              Pap taken: No. Bimanual Exam:  Uterus:  normal size, contour, position, consistency, mobility, non-tender              Adnexa: no mass, fullness, tenderness              Rectal exam: Yes.  .  Confirms.              Anus:  normal sphincter tone, no lesions  Chaperone was present for exam.  Assessment:   Well woman visit with normal exam. DM.  Low vit D.   Plan: Mammogram screening discussed. Self breast awareness reviewed. Pap and HR HPV as above. Guidelines for Calcium, Vitamin D, regular exercise program including cardiovascular and weight bearing exercise. She will follow up with her PCP and have her complete blood work done.  Referral for colonoscopy.  Follow up annually and prn.

## 2020-12-06 NOTE — Patient Instructions (Signed)

## 2020-12-15 ENCOUNTER — Other Ambulatory Visit: Payer: Self-pay | Admitting: Internal Medicine

## 2020-12-16 LAB — COMPLETE METABOLIC PANEL WITH GFR
AG Ratio: 1.5 (calc) (ref 1.0–2.5)
ALT: 13 U/L (ref 6–29)
AST: 14 U/L (ref 10–35)
Albumin: 4 g/dL (ref 3.6–5.1)
Alkaline phosphatase (APISO): 44 U/L (ref 37–153)
BUN: 9 mg/dL (ref 7–25)
CO2: 22 mmol/L (ref 20–32)
Calcium: 9.5 mg/dL (ref 8.6–10.4)
Chloride: 106 mmol/L (ref 98–110)
Creat: 0.57 mg/dL (ref 0.50–1.05)
GFR, Est African American: 125 mL/min/{1.73_m2} (ref >=60)
GFR, Est Non African American: 108 mL/min/{1.73_m2} (ref >=60)
Globulin: 2.6 g/dL (calc) (ref 1.9–3.7)
Glucose, Bld: 114 mg/dL — ABNORMAL HIGH (ref 65–99)
Potassium: 4 mmol/L (ref 3.5–5.3)
Sodium: 142 mmol/L (ref 135–146)
Total Bilirubin: 0.3 mg/dL (ref 0.2–1.2)
Total Protein: 6.6 g/dL (ref 6.1–8.1)

## 2020-12-16 LAB — TSH: TSH: 1.88 mIU/L

## 2020-12-16 LAB — CBC
HCT: 36.3 % (ref 35.0–45.0)
Hemoglobin: 11 g/dL — ABNORMAL LOW (ref 11.7–15.5)
MCH: 22.5 pg — ABNORMAL LOW (ref 27.0–33.0)
MCHC: 30.3 g/dL — ABNORMAL LOW (ref 32.0–36.0)
MCV: 74.4 fL — ABNORMAL LOW (ref 80.0–100.0)
MPV: 11.9 fL (ref 7.5–12.5)
Platelets: 242 10*3/uL (ref 140–400)
RBC: 4.88 10*6/uL (ref 3.80–5.10)
RDW: 15.7 % — ABNORMAL HIGH (ref 11.0–15.0)
WBC: 5.5 10*3/uL (ref 3.8–10.8)

## 2020-12-16 LAB — LIPID PANEL
Cholesterol: 189 mg/dL (ref <200)
HDL: 53 mg/dL (ref >=50)
LDL Cholesterol (Calc): 119 mg/dL (calc) — ABNORMAL HIGH
Non-HDL Cholesterol (Calc): 136 mg/dL (calc) — ABNORMAL HIGH (ref <130)
Total CHOL/HDL Ratio: 3.6 (calc) (ref <5.0)
Triglycerides: 71 mg/dL (ref <150)

## 2020-12-16 LAB — VITAMIN D 25 HYDROXY (VIT D DEFICIENCY, FRACTURES): Vit D, 25-Hydroxy: 24 ng/mL — ABNORMAL LOW (ref 30–100)

## 2020-12-19 ENCOUNTER — Other Ambulatory Visit (HOSPITAL_COMMUNITY): Payer: Self-pay | Admitting: Internal Medicine

## 2020-12-19 ENCOUNTER — Ambulatory Visit (HOSPITAL_COMMUNITY)
Admission: RE | Admit: 2020-12-19 | Discharge: 2020-12-19 | Disposition: A | Discharge status: Home / Self Care | Payer: 59 | Source: Ambulatory Visit | Attending: Internal Medicine | Admitting: Internal Medicine

## 2020-12-19 ENCOUNTER — Other Ambulatory Visit: Payer: Self-pay

## 2020-12-19 DIAGNOSIS — M79662 Pain in left lower leg: Secondary | ICD-10-CM | POA: Insufficient documentation

## 2020-12-19 DIAGNOSIS — M79661 Pain in right lower leg: Secondary | ICD-10-CM | POA: Diagnosis not present

## 2021-02-07 ENCOUNTER — Other Ambulatory Visit: Payer: Self-pay

## 2021-02-07 ENCOUNTER — Ambulatory Visit (AMBULATORY_SURGERY_CENTER): Payer: Self-pay | Admitting: *Deleted

## 2021-02-07 VITALS — Ht 65.0 in | Wt 266.0 lb

## 2021-02-07 DIAGNOSIS — Z1211 Encounter for screening for malignant neoplasm of colon: Secondary | ICD-10-CM

## 2021-02-07 MED ORDER — NA SULFATE-K SULFATE-MG SULF 17.5-3.13-1.6 GM/177ML PO SOLN: 1.0000 | Freq: Once | ORAL | 0 refills | Status: AC

## 2021-02-07 NOTE — Progress Notes (Signed)

## 2021-02-08 ENCOUNTER — Encounter: Payer: Self-pay | Admitting: Gastroenterology

## 2021-02-21 ENCOUNTER — Ambulatory Visit (AMBULATORY_SURGERY_CENTER): Payer: 59 | Admitting: Gastroenterology

## 2021-02-21 ENCOUNTER — Encounter: Payer: Self-pay | Admitting: Gastroenterology

## 2021-02-21 ENCOUNTER — Other Ambulatory Visit: Payer: Self-pay

## 2021-02-21 VITALS — BP 108/70 | HR 96 | Temp 97.8°F | Resp 20 | Ht 65.0 in | Wt 266.0 lb

## 2021-02-21 DIAGNOSIS — D128 Benign neoplasm of rectum: Secondary | ICD-10-CM

## 2021-02-21 DIAGNOSIS — D125 Benign neoplasm of sigmoid colon: Secondary | ICD-10-CM | POA: Diagnosis not present

## 2021-02-21 DIAGNOSIS — D123 Benign neoplasm of transverse colon: Secondary | ICD-10-CM

## 2021-02-21 DIAGNOSIS — Z1211 Encounter for screening for malignant neoplasm of colon: Secondary | ICD-10-CM

## 2021-02-21 DIAGNOSIS — D120 Benign neoplasm of cecum: Secondary | ICD-10-CM

## 2021-02-21 HISTORY — PX: COLONOSCOPY: SHX174

## 2021-02-21 MED ORDER — SODIUM CHLORIDE 0.9 % IV SOLN
500.0000 mL | Freq: Once | INTRAVENOUS | Status: DC
Start: 2021-02-21 — End: 2021-02-21

## 2021-02-21 NOTE — Progress Notes (Signed)
1036 HR > 100 with esmolol 25 mg given IV, MD updated, vss

## 2021-02-21 NOTE — Progress Notes (Signed)
Pt's states no medical or surgical changes since previsit or office visit.  ° °Vitals CW °

## 2021-02-21 NOTE — Progress Notes (Signed)
Report given to PACU, vss 

## 2021-02-21 NOTE — Op Note (Signed)
[facility] Patient Name: Sonya Gallegos Procedure Date: 02/21/2021 10:24 AM MRN: [id number] Endoscopist: Thornton Park MD, MD Age: 50 Referring MD:  Date of Birth: 10-Feb-1971 Gender: Female Account #: [id number] Procedure:                Colonoscopy Indications:              Screening for colorectal malignant neoplasm, This                            is the patient's first colonoscopy                           No known family history of colon cancer or polyps Medicines:                Monitored Anesthesia Care Procedure:                Pre-Anesthesia Assessment:                           - Prior to the procedure, a History and Physical                            was performed, and patient medications and                            allergies were reviewed. The patient's tolerance of                            previous anesthesia was also reviewed. The risks                            and benefits of the procedure and the sedation                            options and risks were discussed with the patient.                            All questions were answered, and informed consent                            was obtained. Prior Anticoagulants: The patient has                            taken no previous anticoagulant or antiplatelet                            agents. ASA Grade Assessment: III - A patient with                            severe systemic disease. After reviewing the risks                            and benefits, the patient was deemed in  satisfactory condition to undergo the procedure.                           After obtaining informed consent, the colonoscope                            was passed under direct vision. Throughout the                            procedure, the patient's blood pressure, pulse, and                            oxygen saturations were monitored continuously. The                            Olympus CF-HQ190L  [id number]) Colonoscope was                            introduced through the anus and advanced to the 2                            cm into the ileum. The colonoscopy was performed                            with moderate difficulty due to a redundant colon,                            significant looping and a tortuous colon.                            Successful completion of the procedure was aided by                            changing the patient's position, withdrawing and                            reinserting the scope and applying abdominal                            pressure. The patient tolerated the procedure well.                            The quality of the bowel preparation was good. The                            terminal ileum, ileocecal valve, appendiceal                            orifice, and rectum were photographed. Scope In: 10:31:17 AM Scope Out: 10:55:53 AM Scope Withdrawal Time: 0 hours 17 minutes 28 seconds  Total Procedure Duration: 0 hours 24 minutes 36 seconds  Findings:                 The perianal and digital rectal examinations were  normal.                           Non-bleeding internal hemorrhoids were found.                           Two sessile polyps were found in the rectum. The                            polyps were 3 to 5 mm in size. These polyps were                            removed with a cold snare. Resection and retrieval                            were complete. Estimated blood loss was minimal.                           Two sessile polyps were found in the proximal                            rectum. The polyps were 2 to 3 mm in size. These                            polyps were removed with a cold snare. Resection                            and retrieval were complete. Estimated blood loss                            was minimal.                           Two sessile polyps were found in the sigmoid colon.                             The polyps were 2 mm in size. These polyps were                            removed with a cold snare. Resection and retrieval                            were complete. Estimated blood loss was minimal.                           Two sessile polyps were found in the hepatic                            flexure. The polyps were 3 to 5 mm in size. These                            polyps were removed with a cold snare. Resection  and retrieval were complete. Estimated blood loss                            was minimal.                           A 2 mm polyp was found in the cecum. The polyp was                            flat. The polyp was removed with a cold snare.                            Resection and retrieval were complete. Estimated                            blood loss was minimal.                           The exam was otherwise without abnormality on                            direct and retroflexion views. Complications:            No immediate complications. Estimated blood loss:                            Minimal. Estimated Blood Loss:     Estimated blood loss was minimal. Impression:               - Non-bleeding internal hemorrhoids.                           - Two 3 to 5 mm polyps in the rectum, removed with                            a cold snare. Resected and retrieved.                           - Two 2 to 3 mm polyps in the proximal rectum,                            removed with a cold snare. Resected and retrieved.                           - Two 2 mm polyps in the sigmoid colon, removed                            with a cold snare. Resected and retrieved.                           - Two 3 to 5 mm mm polyps at the hepatic flexure,                            removed with a cold snare. Resected and  retrieved.                           - One 2 mm polyp in the cecum, removed with a cold                            snare. Resected and  retrieved.                           - The examination was otherwise normal on direct                            and retroflexion views. Recommendation:           - Patient has a contact number available for                            emergencies. The signs and symptoms of potential                            delayed complications were discussed with the                            patient. Return to normal activities tomorrow.                            Written discharge instructions were provided to the                            patient.                           - Resume previous diet.                           - Continue present medications.                           - Await pathology results.                           - Repeat colonoscopy date to be determined after                            pending pathology results are reviewed for                            surveillance.                           - Emerging evidence supports eating a diet of                            fruits, vegetables, grains, calcium, and yogurt                            while reducing red meat and  alcohol may reduce the                            risk of colon cancer.                           - Given these results, all first degree relatives                            (brothers, sisters, children, parents) should start                            colon cancer screening at age 66.                           - Thank you for allowing me to be involved in your                            colon cancer prevention. Thornton Park MD, MD 02/21/2021 11:02:37 AM This report has been signed electronically.

## 2021-02-21 NOTE — Progress Notes (Signed)
1035 Robinul 0.2 mg IV given due large amount of secretions upon assessment.  MD made aware, vss

## 2021-02-21 NOTE — Patient Instructions (Signed)
Handouts given for polyps and hemorrhoids.  Await pathology results.  All of your first degree relatives should have colon screening at age 51 based on today's findings.  YOU HAD AN ENDOSCOPIC PROCEDURE TODAY AT [facility]:   Refer to the procedure report that was given to you for any specific questions about what was found during the examination.  If the procedure report does not answer your questions, please call your gastroenterologist to clarify.  If you requested that your care partner not be given the details of your procedure findings, then the procedure report has been included in a sealed envelope for you to review at your convenience later.  YOU SHOULD EXPECT: Some feelings of bloating in the abdomen. Passage of more gas than usual.  Walking can help get rid of the air that was put into your GI tract during the procedure and reduce the bloating. If you had a lower endoscopy (such as a colonoscopy or flexible sigmoidoscopy) you may notice spotting of blood in your stool or on the toilet paper. If you underwent a bowel prep for your procedure, you may not have a normal bowel movement for a few days.  Please Note:  You might notice some irritation and congestion in your nose or some drainage.  This is from the oxygen used during your procedure.  There is no need for concern and it should clear up in a day or so.  SYMPTOMS TO REPORT IMMEDIATELY:   Following lower endoscopy (colonoscopy or flexible sigmoidoscopy):  Excessive amounts of blood in the stool  Significant tenderness or worsening of abdominal pains  Swelling of the abdomen that is new, acute  Fever of 100F or higher  For urgent or emergent issues, a gastroenterologist can be reached at any hour by calling [phone]. Do not use MyChart messaging for urgent concerns.    DIET:  We do recommend a small meal at first, but then you may proceed to your regular diet.  Drink plenty of fluids but you should  avoid alcoholic beverages for 24 hours.  ACTIVITY:  You should plan to take it easy for the rest of today and you should NOT DRIVE or use heavy machinery until tomorrow (because of the sedation medicines used during the test).    FOLLOW UP: Our staff will call the number listed on your records 48-72 hours following your procedure to check on you and address any questions or concerns that you may have regarding the information given to you following your procedure. If we do not reach you, we will leave a message.  We will attempt to reach you two times.  During this call, we will ask if you have developed any symptoms of COVID 19. If you develop any symptoms (ie: fever, flu-like symptoms, shortness of breath, cough etc.) before then, please call [phone].  If you test positive for Covid 19 in the 2 weeks post procedure, please call and report this information to [place].    If any biopsies were taken you will be contacted by phone or by letter within the next 1-3 weeks.  Please call us at [phone] if you have not heard about the biopsies in 3 weeks.    SIGNATURES/CONFIDENTIALITY: You and/or your care partner have signed paperwork which will be entered into your electronic medical record.  These signatures attest to the fact that that the information above on your After Visit Summary has been reviewed and is understood.  Full responsibility of the confidentiality  of this discharge information lies with you and/or your care-partner.

## 2021-02-21 NOTE — Progress Notes (Signed)
Called to room to assist during endoscopic procedure.  Patient ID and intended procedure confirmed with present staff. Received instructions for my participation in the procedure from the performing physician.  

## 2021-02-23 ENCOUNTER — Telehealth: Payer: Self-pay | Admitting: *Deleted

## 2021-02-23 NOTE — Telephone Encounter (Signed)
  Follow up Call-  Call back number 02/21/2021  Post procedure Call Back phone  # [phone]  Permission to leave phone message Yes  Some recent data might be hidden     Patient questions:  Do you have a fever, pain , or abdominal swelling? No. Pain Score  0 *  Have you tolerated food without any problems? Yes.    Have you been able to return to your normal activities? Yes.    Do you have any questions about your discharge instructions: Diet   No. Medications  No. Follow up visit  No.  Do you have questions or concerns about your Care? No.  Actions: * If pain score is 4 or above: No action needed, pain <4.  1. Have you developed a fever since your procedure? no  2.   Have you had an respiratory symptoms (SOB or cough) since your procedure? no  3.   Have you tested positive for COVID 19 since your procedure no  4.   Have you had any family members/close contacts diagnosed with the COVID 19 since your procedure?  no   If yes to any of these questions please route to Joylene John, RN and Joella Prince, RN

## 2021-02-23 NOTE — Telephone Encounter (Signed)
Follow up call made. 

## 2021-03-02 ENCOUNTER — Encounter: Payer: Self-pay | Admitting: Gastroenterology

## 2021-10-18 ENCOUNTER — Other Ambulatory Visit: Payer: Self-pay | Admitting: Internal Medicine

## 2021-10-18 DIAGNOSIS — Z1231 Encounter for screening mammogram for malignant neoplasm of breast: Secondary | ICD-10-CM

## 2021-11-23 ENCOUNTER — Ambulatory Visit: Payer: 59

## 2021-11-23 ENCOUNTER — Ambulatory Visit
Admission: RE | Admit: 2021-11-23 | Discharge: 2021-11-23 | Disposition: A | Discharge status: Home / Self Care | Payer: Medicaid Other | Source: Ambulatory Visit | Attending: Internal Medicine | Admitting: Internal Medicine

## 2021-11-23 DIAGNOSIS — Z1231 Encounter for screening mammogram for malignant neoplasm of breast: Secondary | ICD-10-CM

## 2021-11-24 ENCOUNTER — Other Ambulatory Visit: Payer: Self-pay | Admitting: Internal Medicine

## 2021-11-24 DIAGNOSIS — R928 Other abnormal and inconclusive findings on diagnostic imaging of breast: Secondary | ICD-10-CM

## 2021-12-05 ENCOUNTER — Other Ambulatory Visit: Payer: 59

## 2021-12-08 NOTE — Progress Notes (Deleted)
51 y.o. G78P3003 Married Black or [place] American Not Hispanic or Latino female here for annual exam.      No LMP recorded.          Sexually active: {yes no:314532}  The current method of family planning is {contraception:315051}.    Exercising: {yes no:314532}  {types:19826} Smoker:  {YES NO:22349}  Health Maintenance: Pap:  11-24-18 Neg:Neg HR HPV, 09-06-15 Neg:neg HR HPV History of abnormal Pap:  no MMG:  11-22-20 3D/Neg/density B/BiRads1 BMD:   never  Colonoscopy: never  TDaP:  11/13/18  Gardasil: no    reports that she has never smoked. She has never used smokeless tobacco. She reports that she does not drink alcohol and does not use drugs.  Past Medical History:  Diagnosis Date   Arthritis of knee    Diabetes mellitus ([place])    new onset 01//2021 with Hgb A1c 6.8%   Elevated LDL cholesterol level    Fall 1984   Fibroids    Morbid obesity ([place])    Vitamin D deficiency     Past Surgical History:  Procedure Laterality Date   CATARACT EXTRACTION  01/2012   COLONOSCOPY  54/56/2563   UMBILICAL HERNIA REPAIR  1996    Current Outpatient Medications  Medication Sig Dispense Refill   acetaminophen (TYLENOL) 325 MG tablet Take 650 mg by mouth every 6 (six) hours as needed.     Dulaglutide (TRULICITY [place]) Inject 1 Dose into the skin once a week.     No current facility-administered medications for this visit.    Family History  Problem Relation Age of Onset   Hypertension Mother    Stroke Mother    Diabetes Brother        died at age 31   Cancer Maternal Aunt        Cervical Cancer   Colon polyps Neg Hx    Colon cancer Neg Hx    Esophageal cancer Neg Hx    Stomach cancer Neg Hx    Rectal cancer Neg Hx     Review of Systems  Exam:   There were no vitals taken for this visit.  Weight change: @WEIGHTCHANGE @ Height:      Ht Readings from Last 3 Encounters:  02/21/21 5\' 5"  (1.651 m)  02/07/21 5\' 5"  (1.651 m)  12/06/20 5\' 5"  (1.651 m)    General appearance:  alert, cooperative and appears stated age Head: Normocephalic, without obvious abnormality, atraumatic Neck: no adenopathy, supple, symmetrical, trachea midline and thyroid {CHL AMB PHY EX THYROID NORM [phone]::"normal to inspection and palpation"} Lungs: clear to auscultation bilaterally Cardiovascular: regular rate and rhythm Breasts: {Exam; breast:13139::"normal appearance, no masses or tenderness"} Abdomen: soft, non-tender; non distended,  no masses,  no organomegaly Extremities: extremities normal, atraumatic, no cyanosis or edema Skin: Skin color, texture, turgor normal. No rashes or lesions Lymph nodes: Cervical, supraclavicular, and axillary nodes normal. No abnormal inguinal nodes palpated Neurologic: Grossly normal   Pelvic: External genitalia:  no lesions              Urethra:  normal appearing urethra with no masses, tenderness or lesions              Bartholins and Skenes: normal                 Vagina: normal appearing vagina with normal color and discharge, no lesions              Cervix: {CHL AMB PHY EX CERVIX NORM [phone]::"no lesions"}  Bimanual Exam:  Uterus:  {CHL AMB PHY EX UTERUS NORM [phone]::"normal size, contour, position, consistency, mobility, non-tender"}              Adnexa: {CHL AMB PHY EX ADNEXA NO MASS DEFAULT:[phone]::"no mass, fullness, tenderness"}               Rectovaginal: Confirms               Anus:  normal sphincter tone, no lesions  *** chaperoned for the exam.  A:  Well Woman with normal exam  P:

## 2021-12-11 ENCOUNTER — Telehealth: Payer: Self-pay | Admitting: *Deleted

## 2021-12-11 NOTE — Telephone Encounter (Signed)
Patient called and left message in triage voicemail has question. I called and left message for patient to call.

## 2021-12-13 ENCOUNTER — Ambulatory Visit: Payer: 59 | Admitting: Obstetrics and Gynecology

## 2021-12-21 ENCOUNTER — Ambulatory Visit
Admission: RE | Admit: 2021-12-21 | Discharge: 2021-12-21 | Disposition: A | Discharge status: Home / Self Care | Payer: 59 | Source: Ambulatory Visit | Attending: Internal Medicine | Admitting: Internal Medicine

## 2021-12-21 ENCOUNTER — Other Ambulatory Visit: Payer: Self-pay | Admitting: Internal Medicine

## 2021-12-21 DIAGNOSIS — R928 Other abnormal and inconclusive findings on diagnostic imaging of breast: Secondary | ICD-10-CM

## 2021-12-21 DIAGNOSIS — N6310 Unspecified lump in the right breast, unspecified quadrant: Secondary | ICD-10-CM

## 2022-02-15 ENCOUNTER — Other Ambulatory Visit: Payer: Self-pay | Admitting: Internal Medicine

## 2022-02-16 LAB — COMPLETE METABOLIC PANEL WITH GFR
AG Ratio: 1.4 (calc) (ref 1.0–2.5)
ALT: 29 U/L (ref 6–29)
AST: 23 U/L (ref 10–35)
Albumin: 4.2 g/dL (ref 3.6–5.1)
Alkaline phosphatase (APISO): 53 U/L (ref 37–153)
BUN: 14 mg/dL (ref 7–25)
CO2: 23 mmol/L (ref 20–32)
Calcium: 10.3 mg/dL (ref 8.6–10.4)
Chloride: 105 mmol/L (ref 98–110)
Creat: 0.58 mg/dL (ref 0.50–1.03)
Globulin: 2.9 g/dL (calc) (ref 1.9–3.7)
Glucose, Bld: 134 mg/dL — ABNORMAL HIGH (ref 65–99)
Potassium: 4.2 mmol/L (ref 3.5–5.3)
Sodium: 140 mmol/L (ref 135–146)
Total Bilirubin: 0.3 mg/dL (ref 0.2–1.2)
Total Protein: 7.1 g/dL (ref 6.1–8.1)
eGFR: 109 mL/min/{1.73_m2} (ref >=60)

## 2022-02-16 LAB — LIPID PANEL
Cholesterol: 187 mg/dL (ref <200)
HDL: 58 mg/dL (ref >=50)
LDL Cholesterol (Calc): 103 mg/dL (calc) — ABNORMAL HIGH
Non-HDL Cholesterol (Calc): 129 mg/dL (calc) (ref <130)
Total CHOL/HDL Ratio: 3.2 (calc) (ref <5.0)
Triglycerides: 154 mg/dL — ABNORMAL HIGH (ref <150)

## 2022-02-16 LAB — CBC
HCT: 38.5 % (ref 35.0–45.0)
Hemoglobin: 11.5 g/dL — ABNORMAL LOW (ref 11.7–15.5)
MCH: 22.1 pg — ABNORMAL LOW (ref 27.0–33.0)
MCHC: 29.9 g/dL — ABNORMAL LOW (ref 32.0–36.0)
MCV: 74 fL — ABNORMAL LOW (ref 80.0–100.0)
MPV: 12 fL (ref 7.5–12.5)
Platelets: 258 10*3/uL (ref 140–400)
RBC: 5.2 10*6/uL — ABNORMAL HIGH (ref 3.80–5.10)
RDW: 15.8 % — ABNORMAL HIGH (ref 11.0–15.0)
WBC: 6.9 10*3/uL (ref 3.8–10.8)

## 2022-02-16 LAB — TSH: TSH: 0.47 mIU/L

## 2022-02-16 LAB — VITAMIN D 25 HYDROXY (VIT D DEFICIENCY, FRACTURES): Vit D, 25-Hydroxy: 24 ng/mL — ABNORMAL LOW (ref 30–100)

## 2022-02-27 NOTE — Progress Notes (Signed)
51 y.o. G70P3003 Married Black or [place] American Not Hispanic or Latino female here for annual exam.  She has been having irregular periods. She has some small bumps in her labia area. She denies pain.  ?Cycles have been irregular for a couple of years. Cycles typically range from 30-44 days, currently cycle day #57. Occasional skipped cycle. Bleeds for 5-7 days. She saturates a pad in 4-6 hours (no change). ?She hasn't used contraception for 10 years.  ?Period Duration (Days): 5-7 ?Period Pattern: (!) Irregular ?Menstrual Flow: Heavy ?Menstrual Control: Other (Comment) (depends) ?Menstrual Control Change Freq (Hours): 4 ?Dysmenorrhea: None (achey back) ?She is having mild vasomotor symptoms. No vaginal dryness.  ? ?She just had blood work with her primary and was mildly anemic with a hgb of 11.5. She is going in to review her blood work soon.  ?TSH was 0.47. ? ? ?She has a h/o a mild cystocele and rectocele. Not symptomatic.  ? ?Patient's last menstrual period was 01/09/2022.          ?Sexually active: Yes.    ?The current method of family planning is none.    ?Exercising: Yes.     Walking  ?Smoker:  no ? ?Health Maintenance: ?Pap:  11-24-18 Neg:Neg HR HPV, 09-06-15 Neg:neg HR HPV ?History of abnormal Pap:  no ?MMG:  12/21/21, has f/u in 8/23 see epic  ?BMD:   none  ?Colonoscopy: 02/21/21 polyps f/u 3 years  ?TDaP:  11/13/18  ?Gardasil: n/a ? ? reports that she has never smoked. She has never used smokeless tobacco. She reports that she does not drink alcohol and does not use drugs. 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 Daughters are 63 and 35. 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 Son is 14, in 8th grade.  ? ?Past Medical History:  ?Diagnosis Date  ? Arthritis of knee   ? Diabetes mellitus ([place])   ? new onset 01//2021 with Hgb A1c 6.8%  ? Elevated LDL cholesterol level   ? Fall 1984  ? Fibroids   ? Morbid obesity ([place])   ? Vitamin D deficiency   ? ? ?Past Surgical  History:  ?Procedure Laterality Date  ? CATARACT EXTRACTION  01/2012  ? COLONOSCOPY  02/21/2021  ? [place]  ? ? ?Current Outpatient Medications  ?Medication Sig Dispense Refill  ? acetaminophen (TYLENOL) 325 MG tablet Take 650 mg by mouth every 6 (six) hours as needed.    ? atorvastatin (LIPITOR) 10 MG tablet Take 10 mg by mouth at bedtime.    ? metFORMIN (GLUCOPHAGE) 1000 MG tablet Take 1,000 mg by mouth 2 (two) times daily.    ? ?No current facility-administered medications for this visit.  ? ? ?Family History  ?Problem Relation Age of Onset  ? Hypertension Mother   ? Stroke Mother   ? Diabetes Brother   ?     died at age 1  ? Cancer Maternal Aunt   ?     Cervical Cancer  ? Colon polyps Neg Hx   ? Colon cancer Neg Hx   ? Esophageal cancer Neg Hx   ? Stomach cancer Neg Hx   ? Rectal cancer Neg Hx   ? ? ?Review of Systems  ?All other systems reviewed and are negative. ? ?Exam:   ?BP 122/84   Pulse 86   Ht '5\' 5"'$  (1.651 m)   Wt 265 lb (120.2 kg)   LMP 01/09/2022  SpO2 100%   BMI 44.10 kg/m?   Weight change: '@WEIGHTCHANGE'$ @ Height:   Height: '5\' 5"'$  (165.1 cm)  ?Ht Readings from Last 3 Encounters:  ?03/06/22 '5\' 5"'$  (1.651 m)  ?02/21/21 '5\' 5"'$  (1.651 m)  ?02/07/21 '5\' 5"'$  (1.651 m)  ? ? ?General appearance: alert, cooperative and appears stated age ?Head: Normocephalic, without obvious abnormality, atraumatic ?Neck: no adenopathy, supple, symmetrical, trachea midline and thyroid normal to inspection and palpation ?Lungs: clear to auscultation bilaterally ?Cardiovascular: regular rate and rhythm ?Breasts: normal appearance, no masses or tenderness ?Abdomen: soft, non-tender; non distended,  no masses,  no organomegaly ?Extremities: extremities normal, atraumatic, no cyanosis or edema ?Skin: Skin color, texture, turgor normal. No rashes or lesions ?Lymph nodes: Cervical, supraclavicular, and axillary nodes normal. ?No abnormal inguinal nodes palpated ?Neurologic: Grossly normal ? ? ?Pelvic: External  genitalia:  no lesions ?             Urethra:  normal appearing urethra with no masses, tenderness or lesions ?             Bartholins and Skenes: normal    ?             Vagina: normal appearing vagina with normal color and discharge, no lesions ?             Cervix: no lesions ?              ?Bimanual Exam:  Uterus:   anteverted, mobile, not appreciably enlarged, not tender ?             Adnexa: no mass, fullness, tenderness ?              Rectovaginal: Confirms ?              Anus:  normal sphincter tone, no lesions ? ?Gae Dry chaperoned for the exam. ? ?1. Well woman exam ?No pap this year ?Mammogram in 8/23 ?Colonoscopy UTD ?Discussed breast self exam ?Discussed calcium and vit D intake ? ? ?2. Secondary oligomenorrhea ?She is perimenopausal. Recent normal TSH ?- medroxyPROGESTERone (PROVERA) 5 MG tablet; Take one tablet a day for 5 days every other month if no spontaneous cycles.  Dispense: 15 tablet; Refill: 1 ?-We discussed checking a pregnancy test prior to taking the provera.  ? ?3. History of anemia ?Following up with her primary. Cycles don't sound excessive.  ? ?4. Perimenopausal ?Discussed, information given ? ? ?

## 2022-03-06 ENCOUNTER — Ambulatory Visit (INDEPENDENT_AMBULATORY_CARE_PROVIDER_SITE_OTHER): Payer: 59 | Admitting: Obstetrics and Gynecology

## 2022-03-06 ENCOUNTER — Encounter: Payer: Self-pay | Admitting: Obstetrics and Gynecology

## 2022-03-06 VITALS — BP 122/84 | HR 86 | Ht 65.0 in | Wt 265.0 lb

## 2022-03-06 DIAGNOSIS — Z01419 Encounter for gynecological examination (general) (routine) without abnormal findings: Secondary | ICD-10-CM | POA: Diagnosis not present

## 2022-03-06 DIAGNOSIS — N951 Menopausal and female climacteric states: Secondary | ICD-10-CM | POA: Diagnosis not present

## 2022-03-06 DIAGNOSIS — N914 Secondary oligomenorrhea: Secondary | ICD-10-CM | POA: Diagnosis not present

## 2022-03-06 DIAGNOSIS — Z862 Personal history of diseases of the blood and blood-forming organs and certain disorders involving the immune mechanism: Secondary | ICD-10-CM

## 2022-03-06 MED ORDER — MEDROXYPROGESTERONE ACETATE 5 MG PO TABS: ORAL_TABLET | ORAL | 1 refills | Status: DC

## 2022-03-06 NOTE — Patient Instructions (Addendum)
EXERCISE   We recommended that you start or continue a regular exercise program for good health. Physical activity is anything that gets your body moving, some is better than none. The CDC recommends 150 minutes per week of Moderate-Intensity Aerobic Activity and 2 or more days of Muscle Strengthening Activity. ? ?Benefits of exercise are limitless: helps weight loss/weight maintenance, improves mood and energy, helps with depression and anxiety, improves sleep, tones and strengthens muscles, improves balance, improves bone density, protects from chronic conditions such as heart disease, high blood pressure and diabetes and so much more. ?To learn more visit: WhyNotPoker.uy ? ?DIET: Good nutrition starts with a healthy diet of fruits, vegetables, whole grains, and lean protein sources. Drink plenty of water for hydration. Minimize empty calories, sodium, sweets. For more information about dietary recommendations visit: GeekRegister.com.ee and http://schaefer-mitchell.com/ ? ?ALCOHOL:  Women should limit their alcohol intake to no more than 7 drinks/beers/glasses of wine (combined, not each!) per week. Moderation of alcohol intake to this level decreases your risk of breast cancer and liver damage.  If you are concerned that you may have a problem, or your friends have told you they are concerned about your drinking, there are many resources to help. A well-known program that is free, effective, and available to all people all over the nation is Alcoholics Anonymous.  Check out this site to learn more: BlockTaxes.se ? ? ?CALCIUM AND VITAMIN D:  Adequate intake of calcium and Vitamin D are recommended for bone health.  You should be getting between 1000-1200 mg of calcium and 800 units of Vitamin D daily between diet and supplements ? ?PAP SMEARS:  Pap smears, to check for cervical cancer or precancers,  have traditionally been  done yearly, scientific advances have shown that most women can have pap smears less often.  However, every woman still should have a physical exam from her gynecologist every year. It will include a breast check, inspection of the vulva and vagina to check for abnormal growths or skin changes, a visual exam of the cervix, and then an exam to evaluate the size and shape of the uterus and ovaries. We will also provide age appropriate advice regarding health maintenance, like when you should have certain vaccines, screening for sexually transmitted diseases, bone density testing, colonoscopy, mammograms, etc.  ? ?MAMMOGRAMS:  All women over 82 years old should have a routine mammogram.  ? ?COLON CANCER SCREENING: Now recommend starting at age 60. At this time colonoscopy is not covered for routine screening until 50. There are take home tests that can be done between 45-49.  ? ?COLONOSCOPY:  Colonoscopy to screen for colon cancer is recommended for all women at age 27.  We know, you hate the idea of the prep.  We agree, BUT, having colon cancer and not knowing it is worse!!  Colon cancer so often starts as a polyp that can be seen and removed at colonscopy, which can quite literally save your life!  And if your first colonoscopy is normal and you have no family history of colon cancer, most women don't have to have it again for 10 years.  Once every ten years, you can do something that may end up saving your life, right?  We will be happy to help you get it scheduled when you are ready.  Be sure to check your insurance coverage so you understand how much it will cost.  It may be covered as a preventative service at no cost, but you should check  your particular policy.   ? ? ? ?Breast Self-Awareness ?Breast self-awareness means being familiar with how your breasts look and feel. It involves checking your breasts regularly and reporting any changes to your health care provider. ?Practicing breast self-awareness is  important. A change in your breasts can be a sign of a serious medical problem. Being familiar with how your breasts look and feel allows you to find any problems early, when treatment is more likely to be successful. All women should practice breast self-awareness, including women who have had breast implants. ?How to do a breast self-exam ?One way to learn what is normal for your breasts and whether your breasts are changing is to do a breast self-exam. To do a breast self-exam: ?Look for Changes ? ?Remove all the clothing above your waist. ?Stand in front of a mirror in a room with good lighting. ?Put your hands on your hips. ?Push your hands firmly downward. ?Compare your breasts in the mirror. Look for differences between them (asymmetry), such as: ?Differences in shape. ?Differences in size. ?Puckers, dips, and bumps in one breast and not the other. ?Look at each breast for changes in your skin, such as: ?Redness. ?Scaly areas. ?Look for changes in your nipples, such as: ?Discharge. ?Bleeding. ?Dimpling. ?Redness. ?A change in position. ?Feel for Changes ?Carefully feel your breasts for lumps and changes. It is best to do this while lying on your back on the floor and again while sitting or standing in the shower or tub with soapy water on your skin. Feel each breast in the following way: ?Place the arm on the side of the breast you are examining above your head. ?Feel your breast with the other hand. ?Start in the nipple area and make ? inch (2 cm) overlapping circles to feel your breast. Use the pads of your three middle fingers to do this. Apply light pressure, then medium pressure, then firm pressure. The light pressure will allow you to feel the tissue closest to the skin. The medium pressure will allow you to feel the tissue that is a little deeper. The firm pressure will allow you to feel the tissue close to the ribs. ?Continue the overlapping circles, moving downward over the breast until you feel your  ribs below your breast. ?Move one finger-width toward the center of the body. Continue to use the ? inch (2 cm) overlapping circles to feel your breast as you move slowly up toward your collarbone. ?Continue the up and down exam using all three pressures until you reach your armpit. ? ?Write Down What You Find ? ?Write down what is normal for each breast and any changes that you find. Keep a written record with breast changes or normal findings for each breast. By writing this information down, you do not need to depend only on memory for size, tenderness, or location. Write down where you are in your menstrual cycle, if you are still menstruating. ?If you are having trouble noticing differences in your breasts, do not get discouraged. With time you will become more familiar with the variations in your breasts and more comfortable with the exam. ?How often should I examine my breasts? ?Examine your breasts every month. If you are breastfeeding, the best time to examine your breasts is after a feeding or after using a breast pump. If you menstruate, the best time to examine your breasts is 5-7 days after your period is over. During your period, your breasts are lumpier, and it may be more  difficult to notice changes. ?When should I see my health care provider? ?See your health care provider if you notice: ?A change in shape or size of your breasts or nipples. ?A change in the skin of your breast or nipples, such as a reddened or scaly area. ?Unusual discharge from your nipples. ?A lump or thick area that was not there before. ?Pain in your breasts. ?Anything that concerns you. ?Perimenopause ?Perimenopause is the normal time of a woman's life when the levels of estrogen, the female hormone produced by the ovaries, begin to decrease. This leads to changes in menstrual periods before they stop completely (menopause). Perimenopause can begin 2-8 years before menopause. During perimenopause, the ovaries may or may not  produce an egg and a woman can still become pregnant. ?What are the causes? ?This condition is caused by a natural change in hormone levels that happens as you get older. ?What increases the risk? ?This condi

## 2022-03-16 ENCOUNTER — Telehealth: Payer: Self-pay | Admitting: *Deleted

## 2022-03-16 NOTE — Telephone Encounter (Signed)
Yes, it can take up to 2 weeks after finishing the provera to start a cycle.  ?

## 2022-03-16 NOTE — Telephone Encounter (Signed)
Patient called stating she took 5 days of Provera 5 mg tablet (to take every other month if no cycle) and last pill was yesterday (03/16/27) and not cycle yet. I told patient that I think you want her to follow up with no cycle 2 weeks after taking last pill. But I would confirm with you. Please advise  ?

## 2022-03-22 NOTE — Telephone Encounter (Signed)
Patient was aware of this same day message.  ?

## 2022-06-21 ENCOUNTER — Ambulatory Visit
Admission: RE | Admit: 2022-06-21 | Discharge: 2022-06-21 | Disposition: A | Discharge status: Home / Self Care | Payer: 59 | Source: Ambulatory Visit | Attending: Internal Medicine | Admitting: Internal Medicine

## 2022-06-21 ENCOUNTER — Other Ambulatory Visit (HOSPITAL_COMMUNITY)
Admission: RE | Admit: 2022-06-21 | Discharge: 2022-06-21 | Disposition: A | Discharge status: Home / Self Care | Payer: 59 | Source: Ambulatory Visit | Attending: Radiology | Admitting: Radiology

## 2022-06-21 ENCOUNTER — Other Ambulatory Visit: Payer: Self-pay | Admitting: Internal Medicine

## 2022-06-21 DIAGNOSIS — N6310 Unspecified lump in the right breast, unspecified quadrant: Secondary | ICD-10-CM

## 2022-06-21 DIAGNOSIS — R599 Enlarged lymph nodes, unspecified: Secondary | ICD-10-CM

## 2022-06-21 DIAGNOSIS — R590 Localized enlarged lymph nodes: Secondary | ICD-10-CM | POA: Diagnosis not present

## 2022-06-21 DIAGNOSIS — R928 Other abnormal and inconclusive findings on diagnostic imaging of breast: Secondary | ICD-10-CM | POA: Diagnosis not present

## 2022-06-21 DIAGNOSIS — N62 Hypertrophy of breast: Secondary | ICD-10-CM | POA: Diagnosis not present

## 2022-06-21 DIAGNOSIS — N6313 Unspecified lump in the right breast, lower outer quadrant: Secondary | ICD-10-CM | POA: Diagnosis not present

## 2022-06-21 DIAGNOSIS — N6031 Fibrosclerosis of right breast: Secondary | ICD-10-CM | POA: Diagnosis not present

## 2022-06-22 LAB — SURGICAL PATHOLOGY

## 2022-10-29 ENCOUNTER — Other Ambulatory Visit: Payer: Self-pay | Admitting: Internal Medicine

## 2022-10-29 DIAGNOSIS — Z1231 Encounter for screening mammogram for malignant neoplasm of breast: Secondary | ICD-10-CM

## 2022-12-25 ENCOUNTER — Ambulatory Visit
Admission: RE | Admit: 2022-12-25 | Discharge: 2022-12-25 | Disposition: A | Discharge status: Home / Self Care | Payer: Self-pay | Source: Ambulatory Visit | Attending: Internal Medicine | Admitting: Internal Medicine

## 2022-12-25 DIAGNOSIS — Z1231 Encounter for screening mammogram for malignant neoplasm of breast: Secondary | ICD-10-CM

## 2023-03-06 NOTE — Progress Notes (Deleted)
52 y.o. G7P3003 Married Black or [place] American Not Hispanic or Latino female here for annual exam.      No LMP recorded.          Sexually active: {yes no:314532}  The current method of family planning is {contraception:315051}.    Exercising: {yes no:314532}  {types:19826} Smoker:  {YES NO:22349}  Health Maintenance: Pap: 11-24-18 Neg:Neg HR HPV, 09-06-15 Neg:neg HR HPV History of abnormal Pap:  no MMG:  12/25/22 Bi-rads 1 neg  BMD:   n/a Colonoscopy: 02/21/21 f/u 3 years  TDaP:  11/13/18 Gardasil: ***   reports that she has never smoked. She has never used smokeless tobacco. She reports that she does not drink alcohol and does not use drugs.  Past Medical History:  Diagnosis Date   Arthritis of knee    Diabetes mellitus ([facility])    new onset 01//2021 with Hgb A1c 6.8%   Elevated LDL cholesterol level    Fall 1984   Fibroids    Morbid obesity ([facility])    Vitamin D deficiency     Past Surgical History:  Procedure Laterality Date   CATARACT EXTRACTION  01/2012   COLONOSCOPY  02/21/2021   UMBILICAL HERNIA REPAIR  1996    Current Outpatient Medications  Medication Sig Dispense Refill   acetaminophen (TYLENOL) 325 MG tablet Take 650 mg by mouth every 6 (six) hours as needed.     atorvastatin (LIPITOR) 10 MG tablet Take 10 mg by mouth at bedtime.     medroxyPROGESTERone (PROVERA) 5 MG tablet Take one tablet a day for 5 days every other month if no spontaneous cycles. 15 tablet 1   metFORMIN (GLUCOPHAGE) 1000 MG tablet Take 1,000 mg by mouth 2 (two) times daily.     No current facility-administered medications for this visit.    Family History  Problem Relation Age of Onset   Hypertension Mother    Stroke Mother    Diabetes Brother        died at age 56   Cancer Maternal Aunt        Cervical Cancer   Colon polyps Neg Hx    Colon cancer Neg Hx    Esophageal cancer Neg Hx    Stomach cancer Neg Hx    Rectal cancer Neg Hx     Review of Systems  Exam:   There were no  vitals taken for this visit.  Weight change: @WEIGHTCHANGE @ Height:      Ht Readings from Last 3 Encounters:  03/06/22 5\' 5"  (1.651 m)  02/21/21 5\' 5"  (1.651 m)  02/07/21 5\' 5"  (1.651 m)    General appearance: alert, cooperative and appears stated age Head: Normocephalic, without obvious abnormality, atraumatic Neck: no adenopathy, supple, symmetrical, trachea midline and thyroid {CHL AMB PHY EX THYROID NORM [phone]::"normal to inspection and palpation"} Lungs: clear to auscultation bilaterally Cardiovascular: regular rate and rhythm Breasts: {Exam; breast:13139::"normal appearance, no masses or tenderness"} Abdomen: soft, non-tender; non distended,  no masses,  no organomegaly Extremities: extremities normal, atraumatic, no cyanosis or edema Skin: Skin color, texture, turgor normal. No rashes or lesions Lymph nodes: Cervical, supraclavicular, and axillary nodes normal. No abnormal inguinal nodes palpated Neurologic: Grossly normal   Pelvic: External genitalia:  no lesions              Urethra:  normal appearing urethra with no masses, tenderness or lesions              Bartholins and Skenes: normal  Vagina: normal appearing vagina with normal color and discharge, no lesions              Cervix: {CHL AMB PHY EX CERVIX NORM DEFAULT:[phone]::"no lesions"}               Bimanual Exam:  Uterus:  {CHL AMB PHY EX UTERUS NORM [phone]::"normal size, contour, position, consistency, mobility, non-tender"}              Adnexa: {CHL AMB PHY EX ADNEXA NO MASS [phone]::"no mass, fullness, tenderness"}               Rectovaginal: Confirms               Anus:  normal sphincter tone, no lesions  *** chaperoned for the exam.  A:  Well Woman with normal exam  P:

## 2023-03-13 ENCOUNTER — Ambulatory Visit: Payer: 59 | Admitting: Obstetrics and Gynecology

## 2023-03-13 NOTE — Progress Notes (Signed)
52 y.o. [id number] Married Black or [place] American Not Hispanic or Latino female here for annual exam.  Cycles have been irregular for several years. Last year she was given a script for cycle provera, she didn't tolerate day. Cycles range from 28-66 days. Bleeds x 5 days. Normal flow, no intermenstrual bleeding. She is having tolerable vasomotor symptoms. No dyspareunia, having some dryness.  Period Pattern: (!) Irregular Menstrual Flow: Moderate Menstrual Control:  (period underwear)  Patient's last menstrual period was 01/13/2023.          Sexually active: Yes.    The current method of family planning is none.    Exercising: Yes.     Walking  Smoker:  no  Health Maintenance: Pap:  11-24-18 Neg Neg HR HPV; 09-06-15 Neg:neg HR HPV  History of abnormal Pap:  no MMG:  12/26/22 Density A Bi-rads 1 neg  BMD:   none  Colonoscopy: 02/21/21 polyps f/u 3 years  TDaP:  11/13/18  Gardasil: n/a   reports that she has never smoked. She has never used smokeless tobacco. She reports that she does not drink alcohol and does not use drugs. 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 Daughters are 22 and 20.  Son is 15, in 9 th grade.   Past Medical History:  Diagnosis Date   Arthritis of knee    Diabetes mellitus (HCC)    new onset 01//2021 with Hgb A1c 6.8%   Elevated LDL cholesterol level    Fall 1984   Fibroids    Morbid obesity (HCC)    Vitamin D deficiency     Past Surgical History:  Procedure Laterality Date   CATARACT EXTRACTION  01/2012   COLONOSCOPY  02/21/2021   UMBILICAL HERNIA REPAIR  1996    Current Outpatient Medications  Medication Sig Dispense Refill   acetaminophen (TYLENOL) 325 MG tablet Take 650 mg by mouth every 6 (six) hours as needed.     atorvastatin (LIPITOR) 10 MG tablet Take 10 mg by mouth at bedtime.     metFORMIN (GLUCOPHAGE) 1000 MG tablet Take 1,000 mg by mouth 2 (two) times daily.     No current facility-administered medications for this visit.    Family  History  Problem Relation Age of Onset   Hypertension Mother    Stroke Mother    Diabetes Brother        died at age 12   Cancer Maternal Aunt        Cervical Cancer   Colon polyps Neg Hx    Colon cancer Neg Hx    Esophageal cancer Neg Hx    Stomach cancer Neg Hx    Rectal cancer Neg Hx     Review of Systems  All other systems reviewed and are negative.   Exam:   BP 132/70   Pulse 82   Ht 5\' 6"  (1.676 m)   Wt 265 lb (120.2 kg)   LMP 01/13/2023   SpO2 100%   BMI 42.77 kg/m   Weight change: @WEIGHTCHANGE @ Height:   Height: 5\' 6"  (167.6 cm)  Ht Readings from Last 3 Encounters:  03/19/23 5\' 6"  (1.676 m)  03/06/22 5\' 5"  (1.651 m)  02/21/21 5\' 5"  (1.651 m)    General appearance: alert, cooperative and appears stated age Head: Normocephalic, without obvious abnormality, atraumatic Neck: no adenopathy, supple, symmetrical, trachea midline and thyroid normal to inspection and palpation Lungs: clear to auscultation bilaterally Cardiovascular: regular rate and rhythm Breasts: normal appearance, no masses or tenderness Abdomen:  soft, non-tender; non distended,  no masses,  no organomegaly Extremities: extremities normal, atraumatic, no cyanosis or edema Skin: Skin color, texture, turgor normal. No rashes or lesions Lymph nodes: Cervical, supraclavicular, and axillary nodes normal. No abnormal inguinal nodes palpated Neurologic: Grossly normal   Pelvic: External genitalia:  no lesions              Urethra:  normal appearing urethra with no masses, tenderness or lesions              Bartholins and Skenes: normal                 Vagina: normal appearing vagina with normal color and discharge, no lesions              Cervix: no lesions               Bimanual Exam:  Uterus:   no masses or tenderness              Adnexa: no mass, fullness, tenderness               Rectovaginal: Confirms               Anus:  normal sphincter tone, no lesions  Carolynn Serve, CMA chaperoned for  the exam.  1. Well woman exam Discussed breast self exam Discussed calcium and vit D intake Mammogram and colonoscopy are UTD  2. Screening for cervical cancer - Cytology - PAP  3. Perimenopausal Call with concerns.

## 2023-03-19 ENCOUNTER — Ambulatory Visit (INDEPENDENT_AMBULATORY_CARE_PROVIDER_SITE_OTHER): Payer: Self-pay | Admitting: Obstetrics and Gynecology

## 2023-03-19 ENCOUNTER — Other Ambulatory Visit (HOSPITAL_COMMUNITY)
Admission: RE | Admit: 2023-03-19 | Discharge: 2023-03-19 | Disposition: A | Discharge status: Home / Self Care | Payer: Medicaid Other | Source: Ambulatory Visit | Attending: Obstetrics and Gynecology | Admitting: Obstetrics and Gynecology

## 2023-03-19 ENCOUNTER — Encounter: Payer: Self-pay | Admitting: Obstetrics and Gynecology

## 2023-03-19 VITALS — BP 132/70 | HR 82 | Ht 66.0 in | Wt 265.0 lb

## 2023-03-19 DIAGNOSIS — Z124 Encounter for screening for malignant neoplasm of cervix: Secondary | ICD-10-CM | POA: Insufficient documentation

## 2023-03-19 DIAGNOSIS — Z01419 Encounter for gynecological examination (general) (routine) without abnormal findings: Secondary | ICD-10-CM

## 2023-03-19 DIAGNOSIS — N951 Menopausal and female climacteric states: Secondary | ICD-10-CM

## 2023-03-19 NOTE — Patient Instructions (Signed)

## 2023-03-20 LAB — CYTOLOGY - PAP
Adequacy: ABSENT
Comment: NEGATIVE
Diagnosis: NEGATIVE
High risk HPV: NEGATIVE

## 2023-04-09 ENCOUNTER — Ambulatory Visit: Payer: Medicaid Other

## 2023-04-20 DIAGNOSIS — Z419 Encounter for procedure for purposes other than remedying health state, unspecified: Secondary | ICD-10-CM | POA: Diagnosis not present

## 2023-05-01 DIAGNOSIS — Z0184 Encounter for antibody response examination: Secondary | ICD-10-CM | POA: Diagnosis not present

## 2023-05-10 DIAGNOSIS — Z0000 Encounter for general adult medical examination without abnormal findings: Secondary | ICD-10-CM | POA: Diagnosis not present

## 2023-05-20 DIAGNOSIS — Z419 Encounter for procedure for purposes other than remedying health state, unspecified: Secondary | ICD-10-CM | POA: Diagnosis not present

## 2023-06-20 DIAGNOSIS — Z419 Encounter for procedure for purposes other than remedying health state, unspecified: Secondary | ICD-10-CM | POA: Diagnosis not present

## 2023-07-21 DIAGNOSIS — Z419 Encounter for procedure for purposes other than remedying health state, unspecified: Secondary | ICD-10-CM | POA: Diagnosis not present

## 2023-08-20 DIAGNOSIS — Z419 Encounter for procedure for purposes other than remedying health state, unspecified: Secondary | ICD-10-CM | POA: Diagnosis not present

## 2023-09-20 DIAGNOSIS — Z419 Encounter for procedure for purposes other than remedying health state, unspecified: Secondary | ICD-10-CM | POA: Diagnosis not present

## 2023-10-20 DIAGNOSIS — Z419 Encounter for procedure for purposes other than remedying health state, unspecified: Secondary | ICD-10-CM | POA: Diagnosis not present

## 2023-10-30 DIAGNOSIS — Z13228 Encounter for screening for other metabolic disorders: Secondary | ICD-10-CM | POA: Diagnosis not present

## 2023-10-30 DIAGNOSIS — E559 Vitamin D deficiency, unspecified: Secondary | ICD-10-CM | POA: Diagnosis not present

## 2023-10-30 DIAGNOSIS — R7303 Prediabetes: Secondary | ICD-10-CM | POA: Diagnosis not present

## 2023-10-30 DIAGNOSIS — D519 Vitamin B12 deficiency anemia, unspecified: Secondary | ICD-10-CM | POA: Diagnosis not present

## 2023-11-20 DIAGNOSIS — Z419 Encounter for procedure for purposes other than remedying health state, unspecified: Secondary | ICD-10-CM | POA: Diagnosis not present

## 2023-12-12 ENCOUNTER — Other Ambulatory Visit: Payer: Self-pay | Admitting: Internal Medicine

## 2023-12-12 DIAGNOSIS — Z1231 Encounter for screening mammogram for malignant neoplasm of breast: Secondary | ICD-10-CM

## 2023-12-21 DIAGNOSIS — Z419 Encounter for procedure for purposes other than remedying health state, unspecified: Secondary | ICD-10-CM | POA: Diagnosis not present

## 2023-12-27 ENCOUNTER — Ambulatory Visit
Admission: RE | Admit: 2023-12-27 | Discharge: 2023-12-27 | Disposition: A | Discharge status: Home / Self Care | Payer: Medicaid Other | Source: Ambulatory Visit | Attending: Internal Medicine | Admitting: Internal Medicine

## 2023-12-27 DIAGNOSIS — Z1231 Encounter for screening mammogram for malignant neoplasm of breast: Secondary | ICD-10-CM | POA: Diagnosis not present

## 2023-12-30 DIAGNOSIS — E1169 Type 2 diabetes mellitus with other specified complication: Secondary | ICD-10-CM | POA: Diagnosis not present

## 2023-12-30 DIAGNOSIS — E785 Hyperlipidemia, unspecified: Secondary | ICD-10-CM | POA: Diagnosis not present

## 2023-12-30 DIAGNOSIS — Z6841 Body Mass Index (BMI) 40.0 and over, adult: Secondary | ICD-10-CM | POA: Diagnosis not present

## 2024-01-18 DIAGNOSIS — Z419 Encounter for procedure for purposes other than remedying health state, unspecified: Secondary | ICD-10-CM | POA: Diagnosis not present

## 2024-01-20 DIAGNOSIS — Z7689 Persons encountering health services in other specified circumstances: Secondary | ICD-10-CM | POA: Diagnosis not present

## 2024-01-20 DIAGNOSIS — E785 Hyperlipidemia, unspecified: Secondary | ICD-10-CM | POA: Diagnosis not present

## 2024-01-20 DIAGNOSIS — E1169 Type 2 diabetes mellitus with other specified complication: Secondary | ICD-10-CM | POA: Diagnosis not present

## 2024-02-17 DIAGNOSIS — N926 Irregular menstruation, unspecified: Secondary | ICD-10-CM | POA: Diagnosis not present

## 2024-02-17 DIAGNOSIS — Z6841 Body Mass Index (BMI) 40.0 and over, adult: Secondary | ICD-10-CM | POA: Diagnosis not present

## 2024-02-17 DIAGNOSIS — M7918 Myalgia, other site: Secondary | ICD-10-CM | POA: Diagnosis not present

## 2024-02-29 DIAGNOSIS — Z419 Encounter for procedure for purposes other than remedying health state, unspecified: Secondary | ICD-10-CM | POA: Diagnosis not present

## 2024-03-16 DIAGNOSIS — R030 Elevated blood-pressure reading, without diagnosis of hypertension: Secondary | ICD-10-CM | POA: Diagnosis not present

## 2024-03-16 DIAGNOSIS — M7918 Myalgia, other site: Secondary | ICD-10-CM | POA: Diagnosis not present

## 2024-03-16 DIAGNOSIS — E1169 Type 2 diabetes mellitus with other specified complication: Secondary | ICD-10-CM | POA: Diagnosis not present

## 2024-03-16 DIAGNOSIS — Z6841 Body Mass Index (BMI) 40.0 and over, adult: Secondary | ICD-10-CM | POA: Diagnosis not present

## 2024-03-19 ENCOUNTER — Ambulatory Visit (INDEPENDENT_AMBULATORY_CARE_PROVIDER_SITE_OTHER): Admitting: Obstetrics and Gynecology

## 2024-03-19 ENCOUNTER — Encounter: Payer: Self-pay | Admitting: Obstetrics and Gynecology

## 2024-03-19 VITALS — BP 118/72 | HR 99 | Ht 64.75 in | Wt 250.0 lb

## 2024-03-19 DIAGNOSIS — E2839 Other primary ovarian failure: Secondary | ICD-10-CM | POA: Diagnosis not present

## 2024-03-19 DIAGNOSIS — Z01419 Encounter for gynecological examination (general) (routine) without abnormal findings: Secondary | ICD-10-CM

## 2024-03-19 DIAGNOSIS — N950 Postmenopausal bleeding: Secondary | ICD-10-CM

## 2024-03-19 DIAGNOSIS — Z1331 Encounter for screening for depression: Secondary | ICD-10-CM

## 2024-03-19 DIAGNOSIS — Z1211 Encounter for screening for malignant neoplasm of colon: Secondary | ICD-10-CM

## 2024-03-19 DIAGNOSIS — E785 Hyperlipidemia, unspecified: Secondary | ICD-10-CM | POA: Diagnosis not present

## 2024-03-19 DIAGNOSIS — E559 Vitamin D deficiency, unspecified: Secondary | ICD-10-CM | POA: Diagnosis not present

## 2024-03-19 DIAGNOSIS — E119 Type 2 diabetes mellitus without complications: Secondary | ICD-10-CM | POA: Diagnosis not present

## 2024-03-19 NOTE — Progress Notes (Signed)
 53 y.o. y.o. female here for annual exam. No LMP recorded. (Menstrual status: Perimenopausal).   [id number] Married Black or [place] American Not Hispanic or Latino female here for annual exam.  She is from [place].  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 Body mass index is 41.92 kg/m.  Patient's last menstrual period was 01/14/2024.         She is still having bleeding.  Had spotting for 2 weeks in March and nothing in April. Denies anymore hot flashes but did have them before. Has known fibroids Sexually active: Yes.    The current method of family planning is none.    Exercising: Yes.    Walking  Smoker:  no Tried provera  after last visit for bleeding and she could not take it due to nausea Health Maintenance: Pap:  4/24 Neg Neg HR HPV; to do q 3 years History of abnormal Pap:  no MMG:  12/27/23 Bi-rads 1 neg  BMD:   to get baseline with risk of A2DM  Colonoscopy: 02/21/21 polyps f/u 3 years referral placed  TDaP:  11/13/18  Gardasil: n/a Fibroids and PMB to get labs and PUS  Body mass index is 41.92 kg/m.     03/19/2024    3:08 PM 01/08/2020   10:05 AM 11/13/2018    9:38 AM  Depression screen PHQ 2/9  Decreased Interest 0 0 0  Down, Depressed, Hopeless 0 0 0  PHQ - 2 Score 0 0 0    Blood pressure 118/72, pulse 99, height 5' 4.75" (1.645 m), weight 250 lb (113.4 kg), SpO2 96%.     Component Value Date/Time   DIAGPAP  03/19/2023 1524    - Negative for intraepithelial lesion or malignancy (NILM)   DIAGPAP  11/24/2018 0000    NEGATIVE FOR INTRAEPITHELIAL LESIONS OR MALIGNANCY.   HPVHIGH Negative 03/19/2023 1524   ADEQPAP  03/19/2023 1524    Satisfactory for evaluation; transformation zone component ABSENT.   ADEQPAP  11/24/2018 0000    Satisfactory for evaluation  endocervical/transformation zone component PRESENT.    GYN HISTORY:    Component Value Date/Time   DIAGPAP  03/19/2023 1524    -  Negative for intraepithelial lesion or malignancy (NILM)   DIAGPAP  11/24/2018 0000    NEGATIVE FOR INTRAEPITHELIAL LESIONS OR MALIGNANCY.   HPVHIGH Negative 03/19/2023 1524   ADEQPAP  03/19/2023 1524    Satisfactory for evaluation; transformation zone component ABSENT.   ADEQPAP  11/24/2018 0000    Satisfactory for evaluation  endocervical/transformation zone component PRESENT.    OB History  Gravida Para Term Preterm AB Living  3 3 3   3   SAB IAB Ectopic Multiple Live Births      3    # Outcome Date GA Lbr Len/2nd Weight Sex Type Anes PTL Lv  3 Term 08/2007 [redacted]w[redacted]d  3 lb 7.5 oz (1.573 kg) M Vag-Spont   LIV  2 Term 03/2002 [redacted]w[redacted]d  3 lb 8 oz (1.588 kg) F Vag-Spont   LIV  1 Term 07/2000 [redacted]w[redacted]d  3 lb 5 oz (1.503 kg) F Vag-Spont   LIV    Past Medical History:  Diagnosis Date   Arthritis of knee    Diabetes mellitus (HCC)    new onset 01//2021 with Hgb A1c 6.8%   Elevated LDL cholesterol level    Fall 1984   Fibroids    Morbid obesity (HCC)  Vitamin D  deficiency     Past Surgical History:  Procedure Laterality Date   CATARACT EXTRACTION  01/2012   COLONOSCOPY  02/21/2021   UMBILICAL HERNIA REPAIR  1996    Current Outpatient Medications on File Prior to Visit  Medication Sig Dispense Refill   Blood Glucose Monitoring Suppl (ACCU-CHEK GUIDE) w/Device KIT USE   TO CHECK GLUCOSE ONCE DAILY     Glucose Blood (BLOOD GLUCOSE TEST STRIPS) STRP 1 each by Other route.     OZEMPIC, 1 MG/DOSE, 4 MG/3ML SOPN Inject 1 mg into the skin once a week. (Patient not taking: Reported on 03/19/2024)     acetaminophen (TYLENOL) 325 MG tablet Take 650 mg by mouth every 6 (six) hours as needed.     OZEMPIC, 0.25 OR 0.5 MG/DOSE, 2 MG/3ML SOPN INJECT 0.25 MG INTO THE SKIN ONCE A WEEK FOR 3 WEEKS. INCREASE TO 0.5 MG INTO THE SKIN AFTER THAT WEEKLY     No current facility-administered medications on file prior to visit.    Social History   Socioeconomic History   Marital status: Married    Spouse  name: Not on file   Number of children: Not on file   Years of education: Not on file   Highest education level: Not on file  Occupational History   Not on file  Tobacco Use   Smoking status: Never   Smokeless tobacco: Never  Vaping Use   Vaping status: Never Used  Substance and Sexual Activity   Alcohol use: No    Alcohol/week: 0.0 standard drinks of alcohol   Drug use: No   Sexual activity: Yes    Partners: Male    Birth control/protection: None  Other Topics Concern   Not on file  Social History Narrative   Not on file   Social Drivers of Health   Financial Resource Strain: Low Risk  (01/20/2024)   Received from [facility]   Overall Financial Resource Strain (CARDIA)    Difficulty of Paying Living Expenses: Not hard at all  Food Insecurity: No Food Insecurity (01/20/2024)   Received from [facility]   Hunger Vital Sign    Worried About Running Out of Food in the Last Year: Never true    Ran Out of Food in the Last Year: Never true  Transportation Needs: No Transportation Needs (01/20/2024)   Received from [facility] - Transportation    Lack of Transportation (Medical): No    Lack of Transportation (Non-Medical): No  Physical Activity: Unknown (12/30/2023)   Received from [facility]   Exercise Vital Sign    Days of Exercise per Week: 1 day    Minutes of Exercise per Session: Not on file  Stress: No Stress Concern Present (12/30/2023)   Received from [facility] - Occupational Stress Questionnaire    Feeling of Stress : Only a little  Social Connections: Moderately Integrated (12/30/2023)   Received from [facility]   Social Network    How would you rate your social network (family, work, friends)?: Adequate participation with social networks  Intimate Partner Violence: Not At Risk (12/30/2023)   Received from [facility]   HITS    Over the last 12 months how often did your partner physically hurt  you?: Never    Over the last 12 months how often did your partner insult you or talk down to you?: Never    Over the last 12 months how often  did your partner threaten you with physical harm?: Never    Over the last 12 months how often did your partner scream or curse at you?: Never    Family History  Problem Relation Age of Onset   Hypertension Mother    Stroke Mother    Diabetes Brother        died at age 19   Cancer Maternal Aunt        Cervical Cancer   Colon polyps Neg Hx    Colon cancer Neg Hx    Esophageal cancer Neg Hx    Stomach cancer Neg Hx    Rectal cancer Neg Hx      No Known Allergies    Patient's last menstrual period was No LMP recorded. (Menstrual status: Perimenopausal)..             Review of Systems Alls systems reviewed and are negative.     Physical Exam Constitutional:      Appearance: Normal appearance.  Genitourinary:     Vulva and urethral meatus normal.     No lesions in the vagina.     Genitourinary Comments: Possible yeast infection     Right Labia: No rash, lesions or skin changes.    Left Labia: No lesions, skin changes or rash.    No vaginal discharge or tenderness.     No vaginal prolapse present.    No vaginal atrophy present.     Right Adnexa: not tender, not palpable and no mass present.    Left Adnexa: not tender, not palpable and no mass present.    Cervical discharge present.     No cervical motion tenderness.     Uterus is enlarged and irregular.     Uterus is not tender.  Breasts:    Right: Normal.     Left: Normal.  HENT:     Head: Normocephalic.  Neck:     Thyroid : No thyroid  mass, thyromegaly or thyroid  tenderness.  Cardiovascular:     Rate and Rhythm: Normal rate and regular rhythm.     Heart sounds: Normal heart sounds, S1 normal and S2 normal.  Pulmonary:     Effort: Pulmonary effort is normal.     Breath sounds: Normal breath sounds and air entry.  Abdominal:     General: There is no distension.      Palpations: Abdomen is soft. There is no mass.     Tenderness: There is no abdominal tenderness. There is no guarding or rebound.  Musculoskeletal:        General: Normal range of motion.     Cervical back: Full passive range of motion without pain, normal range of motion and neck supple. No tenderness.     Right lower leg: No edema.     Left lower leg: No edema.  Neurological:     Mental Status: She is alert.  Skin:    General: Skin is warm.  Psychiatric:        Mood and Affect: Mood normal.        Behavior: Behavior normal.        Thought Content: Thought content normal.  Vitals and nursing note reviewed. Exam conducted with a chaperone present.       A:         Well Woman GYN exam                             P:  Pap smear not indicated Encouraged annual mammogram screening Colon cancer screening referral placed today DXA ordered today Labs and immunizations to do with PMD Discussed breast self exams Encouraged healthy lifestyle practices Encouraged Vit D and Calcium   Counseled on the importance of the EMB and PUS to evaluate for PMB.  Discussed need for pathology review of cells to ensure the endometrial cells are not cancerous or precancerous.  No follow-ups on file.  Reinaldo Caras

## 2024-03-20 ENCOUNTER — Encounter: Payer: Self-pay | Admitting: Obstetrics and Gynecology

## 2024-03-20 LAB — SURESWAB® ADVANCED VAGINITIS PLUS,TMA
C. trachomatis RNA, TMA: NOT DETECTED
CANDIDA SPECIES: NOT DETECTED
Candida glabrata: NOT DETECTED
N. gonorrhoeae RNA, TMA: NOT DETECTED
SURESWAB(R) ADV BACTERIAL VAGINOSIS(BV),TMA: NEGATIVE
TRICHOMONAS VAGINALIS (TV),TMA: NOT DETECTED

## 2024-03-20 LAB — FOLLICLE STIMULATING HORMONE: FSH: 27.6 m[IU]/mL

## 2024-03-20 LAB — ESTRADIOL: Estradiol: 22 pg/mL

## 2024-03-20 LAB — HEMOGLOBIN A1C
Hgb A1c MFr Bld: 9 % — ABNORMAL HIGH (ref <5.7)
Mean Plasma Glucose: 212 mg/dL
eAG (mmol/L): 11.7 mmol/L

## 2024-03-20 LAB — VITAMIN D 25 HYDROXY (VIT D DEFICIENCY, FRACTURES): Vit D, 25-Hydroxy: 17 ng/mL — ABNORMAL LOW (ref 30–100)

## 2024-03-23 ENCOUNTER — Ambulatory Visit (HOSPITAL_BASED_OUTPATIENT_CLINIC_OR_DEPARTMENT_OTHER)
Admission: RE | Admit: 2024-03-23 | Discharge: 2024-03-23 | Disposition: A | Discharge status: Home / Self Care | Source: Ambulatory Visit | Attending: Obstetrics and Gynecology | Admitting: Obstetrics and Gynecology

## 2024-03-23 DIAGNOSIS — E2839 Other primary ovarian failure: Secondary | ICD-10-CM | POA: Insufficient documentation

## 2024-03-23 DIAGNOSIS — Z13820 Encounter for screening for osteoporosis: Secondary | ICD-10-CM | POA: Diagnosis not present

## 2024-03-23 DIAGNOSIS — Z01419 Encounter for gynecological examination (general) (routine) without abnormal findings: Secondary | ICD-10-CM | POA: Insufficient documentation

## 2024-03-24 ENCOUNTER — Encounter: Payer: Self-pay | Admitting: Obstetrics and Gynecology

## 2024-03-24 NOTE — Addendum Note (Signed)
 Addended by: Reinaldo Caras on: 03/24/2024 03:12 PM   Modules accepted: Level of Service

## 2024-03-30 DIAGNOSIS — Z419 Encounter for procedure for purposes other than remedying health state, unspecified: Secondary | ICD-10-CM | POA: Diagnosis not present

## 2024-03-31 ENCOUNTER — Ambulatory Visit (INDEPENDENT_AMBULATORY_CARE_PROVIDER_SITE_OTHER)

## 2024-03-31 DIAGNOSIS — N950 Postmenopausal bleeding: Secondary | ICD-10-CM | POA: Diagnosis not present

## 2024-03-31 DIAGNOSIS — Z01419 Encounter for gynecological examination (general) (routine) without abnormal findings: Secondary | ICD-10-CM

## 2024-04-10 ENCOUNTER — Ambulatory Visit (INDEPENDENT_AMBULATORY_CARE_PROVIDER_SITE_OTHER): Admitting: Obstetrics and Gynecology

## 2024-04-10 ENCOUNTER — Encounter: Payer: Self-pay | Admitting: Obstetrics and Gynecology

## 2024-04-10 ENCOUNTER — Other Ambulatory Visit (HOSPITAL_COMMUNITY)
Admission: RE | Admit: 2024-04-10 | Discharge: 2024-04-10 | Disposition: A | Discharge status: Home / Self Care | Source: Ambulatory Visit | Attending: Obstetrics and Gynecology | Admitting: Obstetrics and Gynecology

## 2024-04-10 VITALS — BP 120/80 | HR 110

## 2024-04-10 DIAGNOSIS — Z01812 Encounter for preprocedural laboratory examination: Secondary | ICD-10-CM | POA: Insufficient documentation

## 2024-04-10 DIAGNOSIS — N950 Postmenopausal bleeding: Secondary | ICD-10-CM | POA: Diagnosis not present

## 2024-04-10 DIAGNOSIS — D219 Benign neoplasm of connective and other soft tissue, unspecified: Secondary | ICD-10-CM | POA: Diagnosis not present

## 2024-04-10 DIAGNOSIS — N841 Polyp of cervix uteri: Secondary | ICD-10-CM | POA: Diagnosis not present

## 2024-04-10 DIAGNOSIS — N938 Other specified abnormal uterine and vaginal bleeding: Secondary | ICD-10-CM | POA: Insufficient documentation

## 2024-04-10 DIAGNOSIS — Z01419 Encounter for gynecological examination (general) (routine) without abnormal findings: Secondary | ICD-10-CM

## 2024-04-10 DIAGNOSIS — N840 Polyp of corpus uteri: Secondary | ICD-10-CM | POA: Diagnosis not present

## 2024-04-10 LAB — PREGNANCY, URINE: Preg Test, Ur: NEGATIVE

## 2024-04-10 MED ORDER — WEGOVY 0.25 MG/0.5ML ~~LOC~~ SOAJ: 0.2500 mg | SUBCUTANEOUS | 0 refills | Status: AC

## 2024-04-10 NOTE — Progress Notes (Signed)
 53 y.o. y.o. female here for  EMB Patient's last menstrual period was 03/21/2024 (exact date).   [id number] Married Black or [place] American Not Hispanic or Latino female here for annual exam.  She is from [place].  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  Insurance was denied.  She will see her PMD for possible change to wygovy.  There is no height or weight on file to calculate BMI.  Patient's last menstrual period was 01/14/2024.         She is still having bleeding.  Had spotting for 2 weeks in March and nothing in April. Denies anymore hot flashes but did have them before. Has known fibroids Sexually active: Yes.    The current method of family planning is none.    Exercising: Yes.    Walking  Smoker:  no Tried provera  after last visit for bleeding and she could not take it due to nausea Health Maintenance: Pap:  4/24 Neg Neg HR HPV; to do q 3 years History of abnormal Pap:  no MMG:  12/27/23 Bi-rads 1 neg  BMD:   to get baseline with risk of A2DM  Colonoscopy: 02/21/21 polyps f/u 3 years referral placed  TDaP:  11/13/18  Gardasil: n/a   Status  Final [99]   PACS Intelerad Image Link   Show images for US  PELVIC COMPLETE WITH TRANSVAGINAL Study Result  Narrative & Impression  12.39cm uterus with multiple fibroids 2.90, 3.96,3.41, 2.97, 2.75, 5.15, 2.39cm Endometrial 14mm Feeder vessel noted ?polyp RO not seen LO WNL   No adnexal masses No free fluid     There is no height or weight on file to calculate BMI.     03/19/2024    3:08 PM 01/08/2020   10:05 AM 11/13/2018    9:38 AM  Depression screen PHQ 2/9  Decreased Interest 0 0 0  Down, Depressed, Hopeless 0 0 0  PHQ - 2 Score 0 0 0    Blood pressure 120/80, pulse (!) 110, last menstrual period 03/21/2024, SpO2 97%.     Component Value Date/Time   DIAGPAP  03/19/2023 1524    - Negative for intraepithelial lesion or malignancy  (NILM)   DIAGPAP  11/24/2018 0000    NEGATIVE FOR INTRAEPITHELIAL LESIONS OR MALIGNANCY.   HPVHIGH Negative 03/19/2023 1524   ADEQPAP  03/19/2023 1524    Satisfactory for evaluation; transformation zone component ABSENT.   ADEQPAP  11/24/2018 0000    Satisfactory for evaluation  endocervical/transformation zone component PRESENT.    GYN HISTORY:    Component Value Date/Time   DIAGPAP  03/19/2023 1524    - Negative for intraepithelial lesion or malignancy (NILM)   DIAGPAP  11/24/2018 0000    NEGATIVE FOR INTRAEPITHELIAL LESIONS OR MALIGNANCY.   HPVHIGH Negative 03/19/2023 1524   ADEQPAP  03/19/2023 1524    Satisfactory for evaluation; transformation zone component ABSENT.   ADEQPAP  11/24/2018 0000    Satisfactory for evaluation  endocervical/transformation zone component PRESENT.    OB History  Gravida Para Term Preterm AB Living  3 3 3   3   SAB IAB Ectopic Multiple Live Births      3    # Outcome Date GA Lbr Len/2nd Weight Sex Type Anes PTL Lv  3 Term 08/2007 [redacted]w[redacted]d  3 lb 7.5 oz (1.573 kg) M Vag-Spont   LIV  2 Term 03/2002 [redacted]w[redacted]d  3  lb 8 oz (1.588 kg) F Vag-Spont   LIV  1 Term 07/2000 [redacted]w[redacted]d  3 lb 5 oz (1.503 kg) F Vag-Spont   LIV    Past Medical History:  Diagnosis Date   Arthritis of knee    Diabetes mellitus (HCC)    new onset 01//2021 with Hgb A1c 6.8%   Elevated LDL cholesterol level    Fall 1984   Fibroids    Morbid obesity (HCC)    Vitamin D  deficiency     Past Surgical History:  Procedure Laterality Date   CATARACT EXTRACTION  01/2012   COLONOSCOPY  02/21/2021   UMBILICAL HERNIA REPAIR  1996    Current Outpatient Medications on File Prior to Visit  Medication Sig Dispense Refill   acetaminophen (TYLENOL) 325 MG tablet Take 650 mg by mouth every 6 (six) hours as needed.     Blood Glucose Monitoring Suppl (ACCU-CHEK GUIDE) w/Device KIT USE   TO CHECK GLUCOSE ONCE DAILY     Glucose Blood (BLOOD GLUCOSE TEST STRIPS) STRP 1 each by Other route.     OZEMPIC,  0.25 OR 0.5 MG/DOSE, 2 MG/3ML SOPN INJECT 0.25 MG INTO THE SKIN ONCE A WEEK FOR 3 WEEKS. INCREASE TO 0.5 MG INTO THE SKIN AFTER THAT WEEKLY     No current facility-administered medications on file prior to visit.    Social History   Socioeconomic History   Marital status: Married    Spouse name: Not on file   Number of children: Not on file   Years of education: Not on file   Highest education level: Not on file  Occupational History   Not on file  Tobacco Use   Smoking status: Never   Smokeless tobacco: Never  Vaping Use   Vaping status: Never Used  Substance and Sexual Activity   Alcohol use: No    Alcohol/week: 0.0 standard drinks of alcohol   Drug use: No   Sexual activity: Yes    Partners: Male    Birth control/protection: None  Other Topics Concern   Not on file  Social History Narrative   Not on file   Social Drivers of Health   Financial Resource Strain: Low Risk  (01/20/2024)   Received from [facility]   Overall Financial Resource Strain (CARDIA)    Difficulty of Paying Living Expenses: Not hard at all  Food Insecurity: No Food Insecurity (01/20/2024)   Received from [facility]   Hunger Vital Sign    Worried About Running Out of Food in the Last Year: Never true    Ran Out of Food in the Last Year: Never true  Transportation Needs: No Transportation Needs (01/20/2024)   Received from [facility] - Transportation    Lack of Transportation (Medical): No    Lack of Transportation (Non-Medical): No  Physical Activity: Unknown (12/30/2023)   Received from [facility]   Exercise Vital Sign    Days of Exercise per Week: 1 day    Minutes of Exercise per Session: Not on file  Stress: No Stress Concern Present (12/30/2023)   Received from [facility] - Occupational Stress Questionnaire    Feeling of Stress : Only a little  Social Connections: Moderately Integrated (12/30/2023)   Received from [facility]   Social Network    How would you rate your social network (family, work, friends)?: Adequate participation with social networks  Intimate Partner Violence: Not At Risk (12/30/2023)  Received from [facility]   HITS    Over the last 12 months how often did your partner physically hurt you?: Never    Over the last 12 months how often did your partner insult you or talk down to you?: Never    Over the last 12 months how often did your partner threaten you with physical harm?: Never    Over the last 12 months how often did your partner scream or curse at you?: Never    Family History  Problem Relation Age of Onset   Hypertension Mother    Stroke Mother    Diabetes Brother        died at age 5   Cancer Maternal Aunt        Cervical Cancer   Colon polyps Neg Hx    Colon cancer Neg Hx    Esophageal cancer Neg Hx    Stomach cancer Neg Hx    Rectal cancer Neg Hx      No Known Allergies    Patient's last menstrual period was Patient's last menstrual period was 03/21/2024 (exact date)..             Review of Systems Alls systems reviewed and are negative.      PROCEDURE: EMB Consent obtained for the procedure.  Time out performed. A bivalve speculum was placed in the vagina.  The cervix was grasped with a single tooth tenaculum.  Pipelle was inserted and rotated.  Uterus sound 11cm. Adequate specimen was obtained and sent to pathology.  All instruments were removed.  Patient tolerated the procedure well.  To notify patient of the results.     A:         PMB, fibroids                             P:         EMB collected today. To notify patient of the results. Patient consented on all options. To see if insurance coverage will work for [employer] but encouraged patient to discuss with PMD this coming week.   No follow-ups on file.  Reinaldo Caras

## 2024-04-14 ENCOUNTER — Ambulatory Visit: Payer: Self-pay | Admitting: Obstetrics and Gynecology

## 2024-04-14 DIAGNOSIS — R030 Elevated blood-pressure reading, without diagnosis of hypertension: Secondary | ICD-10-CM | POA: Diagnosis not present

## 2024-04-14 DIAGNOSIS — E66813 Obesity, class 3: Secondary | ICD-10-CM | POA: Diagnosis not present

## 2024-04-14 DIAGNOSIS — Z713 Dietary counseling and surveillance: Secondary | ICD-10-CM | POA: Diagnosis not present

## 2024-04-14 DIAGNOSIS — Z6841 Body Mass Index (BMI) 40.0 and over, adult: Secondary | ICD-10-CM | POA: Diagnosis not present

## 2024-04-14 DIAGNOSIS — E1169 Type 2 diabetes mellitus with other specified complication: Secondary | ICD-10-CM | POA: Diagnosis not present

## 2024-04-14 LAB — SURGICAL PATHOLOGY

## 2024-04-30 DIAGNOSIS — Z419 Encounter for procedure for purposes other than remedying health state, unspecified: Secondary | ICD-10-CM | POA: Diagnosis not present

## 2024-05-01 ENCOUNTER — Ambulatory Visit: Admitting: Obstetrics and Gynecology

## 2024-05-01 DIAGNOSIS — E1169 Type 2 diabetes mellitus with other specified complication: Secondary | ICD-10-CM | POA: Diagnosis not present

## 2024-05-01 DIAGNOSIS — R030 Elevated blood-pressure reading, without diagnosis of hypertension: Secondary | ICD-10-CM | POA: Diagnosis not present

## 2024-05-01 DIAGNOSIS — Z111 Encounter for screening for respiratory tuberculosis: Secondary | ICD-10-CM | POA: Diagnosis not present

## 2024-05-07 ENCOUNTER — Encounter: Payer: Self-pay | Admitting: Obstetrics and Gynecology

## 2024-05-30 DIAGNOSIS — Z419 Encounter for procedure for purposes other than remedying health state, unspecified: Secondary | ICD-10-CM | POA: Diagnosis not present

## 2024-06-05 ENCOUNTER — Ambulatory Visit: Admitting: Obstetrics and Gynecology

## 2024-06-05 ENCOUNTER — Encounter: Payer: Self-pay | Admitting: Obstetrics and Gynecology

## 2024-06-05 VITALS — BP 124/84 | HR 115

## 2024-06-05 DIAGNOSIS — N950 Postmenopausal bleeding: Secondary | ICD-10-CM | POA: Diagnosis not present

## 2024-06-05 DIAGNOSIS — N840 Polyp of corpus uteri: Secondary | ICD-10-CM

## 2024-06-05 DIAGNOSIS — N938 Other specified abnormal uterine and vaginal bleeding: Secondary | ICD-10-CM

## 2024-06-05 DIAGNOSIS — D219 Benign neoplasm of connective and other soft tissue, unspecified: Secondary | ICD-10-CM | POA: Diagnosis not present

## 2024-06-05 NOTE — Progress Notes (Signed)
 53 y.o. y.o. female here for  f/u No LMP recorded. (Menstrual status: Perimenopausal).   [id number] Married Black or [place] American Not Hispanic or Latino female here for annual exam.  She is from [place].  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  Insurance was denied.  She will see her PMD for possible change to wygovy.  Patient with persistent PM bleeding and pelvic pain and discomfort. She would like to move forward with the [facility] and not have to continue with the discomfort and concern about cancer in the future with the fibroids and bleeding.  There is no height or weight on file to calculate BMI.  Patient's last menstrual period was 01/14/2024.         She is still having bleeding.  Had spotting for 2 weeks in March and nothing in April. Denies anymore hot flashes but did have them before. Has known fibroids Sexually active: Yes.    The current method of family planning is none.    Exercising: Yes.    Walking  Smoker:  no Tried provera  after last visit for bleeding and she could not take it due to nausea Health Maintenance: Pap:  4/24 Neg Neg HR HPV; to do q 3 years History of abnormal Pap:  no MMG:  12/27/23 Bi-rads 1 neg  BMD:   03/23/24 normal Colonoscopy: 02/21/21 polyps f/u 3 years referral placed  TDaP:  11/13/18  Gardasil: n/a  FSH 27.6 5/25  Status: Edited Result - FINAL     Next appt: None     Dx: PMB (postmenopausal bleeding); DUB (d...   Test Result Released: Yes (not seen)     Messages: Not Seen   2 Result Notes     1 Patient Communication     View Follow-Up Encounter    Component Ref Range & Units (hover) 2 mo ago  SURGICAL PATHOLOGY SURGICAL PATHOLOGY CASE: [phone] PATIENT: Sonya Gallegos Surgical Pathology Report     Clinical History: PMB, encounter for preprocedure lab exam, fibroids, DUB (cm)     FINAL MICROSCOPIC DIAGNOSIS:  A. ENDOMETRIUM, BIOPSY: -  Fragments of mixed endometrial and endocervical polyp. - Proliferative endometrium. - Negative for atypia/EIN and malignancy.   GROSS DESCRIPTION:  Received in formalin is a 1.3 x 1.2 x 0.3 cm aggregate of pink-red soft tissue/material, submitted 1 block.  SW 04/10/2024  Final Diagnosis performed by Rexene Daily, MD.   Electronically signed 04/14/2024 Technical component performed at [facility]. [facility], [address], [place], [place] [address].  Professional component performed at [facility]. [address] [place], [place] [address]  Immunohistochemistry Technical component (if applicable) was performed at [facility] [employer]. [address], [address], [place], [place] [address].  IMMUNOHISTOCHEMISTRY DISCLAIMER (if applicable): Some of these immunohistochemical stains may have been developed and the performance characteristics determine by [facility]. Some may not have been cleared or approved by the U.S. Food and Drug Administration. The FDA has determined that such clearance or approval is not necessary. This test is used for clinical purposes. It should not be regarded as investigational or for research. This laboratory is certified under the Clinical Laboratory Improvement Amendments of 1988 (CLIA-88) as qualified to perform high complexity clinical laboratory testing.  The controls stained appropriately.      Status  Final [99]   PACS Intelerad Image Link  Show images for US  PELVIC COMPLETE WITH TRANSVAGINAL Study Result  Narrative & Impression  12.39cm uterus with multiple fibroids 2.90, 3.96,3.41, 2.97, 2.75, 5.15, 2.39cm Endometrial 14mm Feeder vessel noted ?polyp RO not seen LO WNL   No adnexal masses No free fluid     There is no height or weight on file to calculate BMI.     03/19/2024    3:08 PM 01/08/2020   10:05 AM 11/13/2018    9:38 AM  Depression screen PHQ 2/9  Decreased  Interest 0 0 0  Down, Depressed, Hopeless 0 0 0  PHQ - 2 Score 0 0 0    Blood pressure 124/84, pulse (!) 115, SpO2 94%.     Component Value Date/Time   DIAGPAP  03/19/2023 1524    - Negative for intraepithelial lesion or malignancy (NILM)   DIAGPAP  11/24/2018 0000    NEGATIVE FOR INTRAEPITHELIAL LESIONS OR MALIGNANCY.   HPVHIGH Negative 03/19/2023 1524   ADEQPAP  03/19/2023 1524    Satisfactory for evaluation; transformation zone component ABSENT.   ADEQPAP  11/24/2018 0000    Satisfactory for evaluation  endocervical/transformation zone component PRESENT.    GYN HISTORY:    Component Value Date/Time   DIAGPAP  03/19/2023 1524    - Negative for intraepithelial lesion or malignancy (NILM)   DIAGPAP  11/24/2018 0000    NEGATIVE FOR INTRAEPITHELIAL LESIONS OR MALIGNANCY.   HPVHIGH Negative 03/19/2023 1524   ADEQPAP  03/19/2023 1524    Satisfactory for evaluation; transformation zone component ABSENT.   ADEQPAP  11/24/2018 0000    Satisfactory for evaluation  endocervical/transformation zone component PRESENT.    OB History  Gravida Para Term Preterm AB Living  3 3 3   3   SAB IAB Ectopic Multiple Live Births      3    # Outcome Date GA Lbr Len/2nd Weight Sex Type Anes PTL Lv  3 Term 08/2007 [redacted]w[redacted]d  3 lb 7.5 oz (1.573 kg) M Vag-Spont   LIV  2 Term 03/2002 103w0d  3 lb 8 oz (1.588 kg) F Vag-Spont   LIV  1 Term 07/2000 [redacted]w[redacted]d  3 lb 5 oz (1.503 kg) F Vag-Spont   LIV    Past Medical History:  Diagnosis Date   Arthritis of knee    Diabetes mellitus (HCC)    new onset 01//2021 with Hgb A1c 6.8%   Elevated LDL cholesterol level    Fall 1984   Fibroids    Morbid obesity (HCC)    Vitamin D  deficiency     Past Surgical History:  Procedure Laterality Date   CATARACT EXTRACTION  01/2012   COLONOSCOPY  02/21/2021   UMBILICAL HERNIA REPAIR  1996    Current Outpatient Medications on File Prior to Visit  Medication Sig Dispense Refill   acetaminophen (TYLENOL) 325 MG tablet  Take 650 mg by mouth every 6 (six) hours as needed.     Blood Glucose Monitoring Suppl (ACCU-CHEK GUIDE) w/Device KIT USE   TO CHECK GLUCOSE ONCE DAILY     Glucose Blood (BLOOD GLUCOSE TEST STRIPS) STRP 1 each by Other route.     Lancets MISC 1 each by Does not apply route.     metFORMIN (GLUCOPHAGE) 1000 MG tablet Take 1,000 mg by mouth 2 (two) times daily.     OZEMPIC, 0.25 OR 0.5 MG/DOSE, 2 MG/3ML SOPN INJECT 0.25 MG INTO THE SKIN ONCE A WEEK FOR 3 WEEKS. INCREASE TO 0.5 MG INTO THE SKIN AFTER THAT WEEKLY  Semaglutide -Weight Management (WEGOVY ) 0.25 MG/0.5ML SOAJ Inject 0.25 mg into the skin once a week. (Patient not taking: Reported on 06/05/2024) 2 mL 0   No current facility-administered medications on file prior to visit.    Social History   Socioeconomic History   Marital status: Married    Spouse name: Not on file   Number of children: Not on file   Years of education: Not on file   Highest education level: Not on file  Occupational History   Not on file  Tobacco Use   Smoking status: Never   Smokeless tobacco: Never  Vaping Use   Vaping status: Never Used  Substance and Sexual Activity   Alcohol use: No    Alcohol/week: 0.0 standard drinks of alcohol   Drug use: No   Sexual activity: Yes    Partners: Male    Birth control/protection: None  Other Topics Concern   Not on file  Social History Narrative   Not on file   Social Drivers of Health   Financial Resource Strain: Low Risk  (01/20/2024)   Received from [facility]   Overall Financial Resource Strain (CARDIA)    Difficulty of Paying Living Expenses: Not hard at all  Food Insecurity: No Food Insecurity (01/20/2024)   Received from [facility]   Hunger Vital Sign    Within the past 12 months, you worried that your food would run out before you got the money to buy more.: Never true    Within the past 12 months, the food you bought just didn't last and you didn't have money to get more.: Never true   Transportation Needs: No Transportation Needs (01/20/2024)   Received from [facility] - Transportation    Lack of Transportation (Medical): No    Lack of Transportation (Non-Medical): No  Physical Activity: Unknown (12/30/2023)   Received from [facility]   Exercise Vital Sign    On average, how many days per week do you engage in moderate to strenuous exercise (like a brisk walk)?: 1 day    Minutes of Exercise per Session: Not on file  Stress: No Stress Concern Present (12/30/2023)   Received from [facility] - Occupational Stress Questionnaire    Feeling of Stress : Only a little  Social Connections: Moderately Integrated (12/30/2023)   Received from [facility]   Social Network    How would you rate your social network (family, work, friends)?: Adequate participation with social networks  Intimate Partner Violence: Not At Risk (12/30/2023)   Received from [facility]   HITS    Over the last 12 months how often did your partner physically hurt you?: Never    Over the last 12 months how often did your partner insult you or talk down to you?: Never    Over the last 12 months how often did your partner threaten you with physical harm?: Never    Over the last 12 months how often did your partner scream or curse at you?: Never    Family History  Problem Relation Age of Onset   Hypertension Mother    Stroke Mother    Diabetes Brother        died at age 80   Cancer Maternal Aunt        Cervical Cancer   Colon polyps Neg Hx    Colon cancer Neg Hx    Esophageal cancer Neg Hx    Stomach cancer  Neg Hx    Rectal cancer Neg Hx      No Known Allergies    Patient's last menstrual period was No LMP recorded. (Menstrual status: Perimenopausal)..             Review of Systems Alls systems reviewed and are negative.        A:         PMB, fibroids, endometrial polyp                             P:          Symptomatic fibroid uterus:  Counseled on all options.  She would like to have the [facility].  Counseled extensively on the procedure including but not limited to what to expect and risks and benefits.  Counseled on postop care and pelvic rest for 10 weeks after the surgery with restricted lifting for 6 weeks after.  Counseled on the benefits of the robotic procedure with faster return to daily activities, improved outcomes, and less risk for complications. She would like to have this scheduled.  30 minutes spent on reviewing records, imaging,  and one on one patient time and counseling patient and documentation Dr. Glennon   No follow-ups on file.  Sonya Gallegos

## 2024-06-30 ENCOUNTER — Encounter: Payer: Self-pay | Admitting: Obstetrics and Gynecology

## 2024-06-30 ENCOUNTER — Ambulatory Visit: Admitting: Obstetrics and Gynecology

## 2024-06-30 VITALS — BP 118/78 | HR 110 | Ht 65.0 in | Wt 254.0 lb

## 2024-06-30 DIAGNOSIS — Z419 Encounter for procedure for purposes other than remedying health state, unspecified: Secondary | ICD-10-CM | POA: Diagnosis not present

## 2024-06-30 DIAGNOSIS — N920 Excessive and frequent menstruation with regular cycle: Secondary | ICD-10-CM

## 2024-06-30 DIAGNOSIS — D251 Intramural leiomyoma of uterus: Secondary | ICD-10-CM

## 2024-06-30 DIAGNOSIS — D250 Submucous leiomyoma of uterus: Secondary | ICD-10-CM

## 2024-06-30 NOTE — Progress Notes (Signed)
 Patient presents for signature of medicaid forms for [facility] and permanent sterilization. She understands the procedure is permanent and she cannot conceive in the future and states she does not was a pregnancy at this point in her life. The forms were reviewed and signed with her. Dr. Glennon  10 minutes spent on reviewing records, imaging,  and one on one patient time and counseling patient and documentation Dr. Glennon

## 2024-07-31 DIAGNOSIS — Z419 Encounter for procedure for purposes other than remedying health state, unspecified: Secondary | ICD-10-CM | POA: Diagnosis not present

## 2024-08-17 DIAGNOSIS — E1169 Type 2 diabetes mellitus with other specified complication: Secondary | ICD-10-CM | POA: Diagnosis not present

## 2024-08-17 DIAGNOSIS — E559 Vitamin D deficiency, unspecified: Secondary | ICD-10-CM | POA: Diagnosis not present

## 2024-08-17 DIAGNOSIS — R1012 Left upper quadrant pain: Secondary | ICD-10-CM | POA: Diagnosis not present

## 2024-10-30 ENCOUNTER — Telehealth: Payer: Self-pay

## 2024-10-30 NOTE — Telephone Encounter (Signed)
 RN attempted call to patient regarding being past due for next colonoscopy. RN unable to leave vm

## 2024-10-30 NOTE — Telephone Encounter (Signed)
 Noted
